# Patient Record
Sex: Female | Born: 1980 | Race: White | Hispanic: No | Marital: Married | Smoking: Current every day smoker
Health system: Southern US, Community
[De-identification: ages and names within clinical notes are randomized; demographics above are authoritative.]

## PROBLEM LIST (undated history)

## (undated) DIAGNOSIS — F32A Depression, unspecified: Secondary | ICD-10-CM

## (undated) DIAGNOSIS — N920 Excessive and frequent menstruation with regular cycle: Secondary | ICD-10-CM

## (undated) DIAGNOSIS — F329 Major depressive disorder, single episode, unspecified: Secondary | ICD-10-CM

## (undated) DIAGNOSIS — E559 Vitamin D deficiency, unspecified: Secondary | ICD-10-CM

## (undated) DIAGNOSIS — E739 Lactose intolerance, unspecified: Secondary | ICD-10-CM

## (undated) DIAGNOSIS — F1911 Other psychoactive substance abuse, in remission: Secondary | ICD-10-CM

## (undated) DIAGNOSIS — R011 Cardiac murmur, unspecified: Secondary | ICD-10-CM

## (undated) DIAGNOSIS — N809 Endometriosis, unspecified: Secondary | ICD-10-CM

## (undated) DIAGNOSIS — N946 Dysmenorrhea, unspecified: Secondary | ICD-10-CM

## (undated) DIAGNOSIS — T7840XA Allergy, unspecified, initial encounter: Secondary | ICD-10-CM

## (undated) DIAGNOSIS — N979 Female infertility, unspecified: Secondary | ICD-10-CM

## (undated) DIAGNOSIS — E785 Hyperlipidemia, unspecified: Secondary | ICD-10-CM

## (undated) DIAGNOSIS — F419 Anxiety disorder, unspecified: Secondary | ICD-10-CM

## (undated) HISTORY — DX: Vitamin D deficiency, unspecified: E55.9

## (undated) HISTORY — DX: Endometriosis, unspecified: N80.9

## (undated) HISTORY — DX: Hyperlipidemia, unspecified: E78.5

## (undated) HISTORY — DX: Allergy, unspecified, initial encounter: T78.40XA

## (undated) HISTORY — DX: Depression, unspecified: F32.A

## (undated) HISTORY — DX: Other psychoactive substance abuse, in remission: F19.11

## (undated) HISTORY — DX: Lactose intolerance, unspecified: E73.9

## (undated) HISTORY — DX: Anxiety disorder, unspecified: F41.9

## (undated) HISTORY — PX: CYSTECTOMY: SUR359

## (undated) HISTORY — DX: Major depressive disorder, single episode, unspecified: F32.9

## (undated) HISTORY — PX: EYE SURGERY: SHX253

## (undated) HISTORY — DX: Excessive and frequent menstruation with regular cycle: N92.0

## (undated) HISTORY — DX: Dysmenorrhea, unspecified: N94.6

## (undated) HISTORY — DX: Female infertility, unspecified: N97.9

---

## 2004-02-06 ENCOUNTER — Emergency Department: Payer: Self-pay | Admitting: Emergency Medicine

## 2004-02-08 ENCOUNTER — Emergency Department: Payer: Self-pay | Admitting: Emergency Medicine

## 2004-02-10 ENCOUNTER — Emergency Department: Payer: Self-pay | Admitting: General Practice

## 2004-02-11 ENCOUNTER — Emergency Department: Payer: Self-pay | Admitting: Emergency Medicine

## 2004-03-29 ENCOUNTER — Emergency Department: Payer: Self-pay | Admitting: Emergency Medicine

## 2004-12-15 ENCOUNTER — Emergency Department: Payer: Self-pay | Admitting: General Practice

## 2007-10-28 ENCOUNTER — Emergency Department: Payer: Self-pay | Admitting: Emergency Medicine

## 2007-12-01 ENCOUNTER — Emergency Department: Payer: Self-pay | Admitting: Emergency Medicine

## 2009-04-04 ENCOUNTER — Emergency Department: Payer: Self-pay | Admitting: Emergency Medicine

## 2010-05-11 ENCOUNTER — Emergency Department: Payer: Self-pay | Admitting: Unknown Physician Specialty

## 2013-06-10 ENCOUNTER — Emergency Department: Payer: Self-pay | Admitting: Emergency Medicine

## 2015-02-01 ENCOUNTER — Other Ambulatory Visit: Payer: Self-pay | Admitting: Obstetrics and Gynecology

## 2015-06-12 ENCOUNTER — Encounter: Payer: Self-pay | Admitting: Obstetrics and Gynecology

## 2015-06-28 ENCOUNTER — Ambulatory Visit (INDEPENDENT_AMBULATORY_CARE_PROVIDER_SITE_OTHER): Payer: 59 | Admitting: Obstetrics and Gynecology

## 2015-06-28 ENCOUNTER — Encounter: Payer: Self-pay | Admitting: Obstetrics and Gynecology

## 2015-06-28 VITALS — BP 95/55 | HR 69 | Ht 61.0 in | Wt 99.3 lb

## 2015-06-28 DIAGNOSIS — N979 Female infertility, unspecified: Secondary | ICD-10-CM

## 2015-06-28 DIAGNOSIS — Z01419 Encounter for gynecological examination (general) (routine) without abnormal findings: Secondary | ICD-10-CM | POA: Diagnosis not present

## 2015-06-28 NOTE — Progress Notes (Signed)
GYNECOLOGY ANNUAL PHYSICAL EXAM PROGRESS NOTE  Subjective:    Anita Hobbs is a 35 y.o.  G0P0 female who presents for an annual exam. The patient has no complaints today. The patient is sexually active. The patient wears seatbelts: yes. The patient participates in regular exercise: no. Has the patient ever been transfused or tattooed?: no. The patient reports that there is not domestic violence in her life.    Gynecologic History Menarche age: 64.  Reports regularly occuring menses, with mild-moderate dysmenorrhea.  Patient's last menstrual period was 06/08/2015. Contraception: none History of STI's: Denies Last Pap: 06/09/2014. Results were: normal.  Denies h/o abnormal pap smears.   Obstetric History   G0   P0   T0   P0   A0   TAB0   SAB0   E0   M0   L0       Past Medical History  Diagnosis Date  . Menorrhagia   . Dysmenorrhea   . Depression   . Anxiety   . Endometriosis   . Infertility, female   . Vitamin D deficiency     Past Surgical History  Procedure Laterality Date  . Cystectomy      @ age 58yrs, rt ovary     History reviewed. No pertinent family history.  Social History   Social History  . Marital Status: Married    Spouse Name: N/A  . Number of Children: N/A  . Years of Education: N/A   Occupational History  . Not on file.   Social History Main Topics  . Smoking status: Current Every Day Smoker -- 0.50 packs/day    Types: Cigarettes  . Smokeless tobacco: Not on file  . Alcohol Use: Yes     Comment: Occasionally   . Drug Use: No  . Sexual Activity: Yes    Birth Control/ Protection: None   Other Topics Concern  . Not on file   Social History Narrative  . No narrative on file    Current Outpatient Prescriptions on File Prior to Visit  Medication Sig Dispense Refill  . traMADol (ULTRAM) 50 MG tablet TAKE ONE TABLET BY MOUTH EVERY 6 HOURS AS NEEDED 30 tablet 0   No current facility-administered medications on file prior to visit.     No Known Allergies   Review of Systems Constitutional: negative for chills, fatigue, fevers and sweats Eyes: negative for irritation, redness and visual disturbance Ears, nose, mouth, throat, and face: negative for hearing loss, nasal congestion, snoring and tinnitus Respiratory: negative for asthma, cough, sputum Cardiovascular: negative for chest pain, dyspnea, exertional chest pressure/discomfort, irregular heart beat, palpitations and syncope Gastrointestinal: negative for abdominal pain, change in bowel habits, nausea and vomiting Genitourinary: negative for abnormal menstrual periods, genital lesions, sexual problems and vaginal discharge, dysuria and urinary incontinence Integument/breast: negative for breast lump, breast tenderness and nipple discharge Hematologic/lymphatic: negative for bleeding and easy bruising Musculoskeletal:negative for back pain and muscle weakness Neurological: negative for dizziness, headaches, vertigo and weakness Endocrine: negative for diabetic symptoms including polydipsia, polyuria and skin dryness Allergic/Immunologic: negative for hay fever and urticaria       Objective:  Blood pressure 95/55, pulse 69, height 5\' 1"  (1.549 m), weight 99 lb 4.8 oz (45.042 kg), last menstrual period 06/08/2015. Body mass index is 18.77 kg/(m^2).     General Appearance:    Alert, cooperative, no distress, appears stated age  Head:    Normocephalic, without obvious abnormality, atraumatic  Eyes:    PERRL, conjunctiva/corneas clear,  EOM's intact, both eyes  Ears:    Normal external ear canals, both ears  Nose:   Nares normal, septum midline, mucosa normal, no drainage or sinus tenderness  Throat:   Lips, mucosa, and tongue normal; teeth and gums normal  Neck:   Supple, symmetrical, trachea midline, no adenopathy; thyroid: no enlargement/tenderness/nodules; no carotid bruit or JVD  Back:     Symmetric, no curvature, ROM normal, no CVA tenderness  Lungs:      Clear to auscultation bilaterally, respirations unlabored  Chest Wall:    No tenderness or deformity   Heart:    Regular rate and rhythm, S1 and S2 normal, no murmur, rub or gallop  Breast Exam:    No tenderness, masses, or nipple abnormality  Abdomen:     Soft, non-tender, bowel sounds active all four quadrants, no masses, no organomegaly.    Genitalia:    Pelvic:external genitalia normal, vagina without lesions, discharge, or tenderness, rectovaginal septum  normal. Cervix normal in appearance, no cervical motion tenderness, no adnexal masses or tenderness.  Uterus normal size, shape, mobile, regular contours, nontender.  Rectal:    Normal external sphincter.  No hemorrhoids appreciated. Internal exam not done.   Extremities:   Extremities normal, atraumatic, no cyanosis or edema  Pulses:   2+ and symmetric all extremities  Skin:   Skin color, texture, turgor normal, no rashes or lesions  Lymph nodes:   Cervical, supraclavicular, and axillary nodes normal  Neurologic:   CNII-XII intact, normal strength, sensation and reflexes throughout   .  Assessment:    Healthy female exam.   Normal weight Contraception Tobacco abuse  Plan:     Blood tests: CBC with diff, Comprehensive metabolic panel, Lipoproteins and Vitamin D levels, and random glucose (desires labs to be performed for job). Breast self exam technique reviewed and patient encouraged to perform self-exam monthly. Contraception: none. Patient notes that she has never been on contraception, and has never gotten pregnant (been with partner x 15 years.  Notes partner has been tested, with normal sperm count, however does have nerve damage due to uncontrolled diabetes.  Patient has not been worked up. Is tracking fertile periods and having intercourse during ovulation period when possible. Discussed potential workup for infertility if desired.  Patient would like to undergo testing.  Will have patient scheduled for HSG.  Discussed  personal risk factors, such as smoking.  Discussed healthy lifestyle modifications. Pap smear up to date, repeat needed in 2019.   Counseled on smoking cessation.  Patient notes that she is not ready to quit, but is willing to try to start cutting down.  Will continue to f/u.  Follow up in 4-6 weeks after HSG (to be performed 3-5 days after next menstrual cycle).    [name], MD [facility]

## 2015-06-29 LAB — CBC WITH DIFFERENTIAL/PLATELET
Basophils Absolute: 0.1 10*3/uL (ref 0.0–0.2)
Basos: 1 %
EOS (ABSOLUTE): 0.1 10*3/uL (ref 0.0–0.4)
EOS: 2 %
HEMATOCRIT: 44.1 % (ref 34.0–46.6)
HEMOGLOBIN: 15 g/dL (ref 11.1–15.9)
IMMATURE GRANS (ABS): 0 10*3/uL (ref 0.0–0.1)
IMMATURE GRANULOCYTES: 0 %
LYMPHS: 29 %
Lymphocytes Absolute: 1.8 10*3/uL (ref 0.7–3.1)
MCH: 32.6 pg (ref 26.6–33.0)
MCHC: 34 g/dL (ref 31.5–35.7)
MCV: 96 fL (ref 79–97)
MONOCYTES: 8 %
Monocytes Absolute: 0.5 10*3/uL (ref 0.1–0.9)
NEUTROS PCT: 60 %
Neutrophils Absolute: 3.8 10*3/uL (ref 1.4–7.0)
Platelets: 108 10*3/uL — ABNORMAL LOW (ref 150–379)
RBC: 4.6 x10E6/uL (ref 3.77–5.28)
RDW: 13.5 % (ref 12.3–15.4)
WBC: 6.2 10*3/uL (ref 3.4–10.8)

## 2015-06-29 LAB — GLUCOSE, RANDOM: Glucose: 87 mg/dL (ref 65–99)

## 2015-06-29 LAB — LIPID PANEL
CHOL/HDL RATIO: 3.8 ratio (ref 0.0–4.4)
Cholesterol, Total: 141 mg/dL (ref 100–199)
HDL: 37 mg/dL — AB (ref >39)
LDL CALC: 93 mg/dL (ref 0–99)
TRIGLYCERIDES: 54 mg/dL (ref 0–149)
VLDL Cholesterol Cal: 11 mg/dL (ref 5–40)

## 2015-06-29 LAB — VITAMIN D 25 HYDROXY (VIT D DEFICIENCY, FRACTURES): Vit D, 25-Hydroxy: 32.5 ng/mL (ref 30.0–100.0)

## 2015-07-02 ENCOUNTER — Encounter: Payer: Self-pay | Admitting: Obstetrics and Gynecology

## 2015-07-02 ENCOUNTER — Telehealth: Payer: Self-pay

## 2015-07-02 DIAGNOSIS — D693 Immune thrombocytopenic purpura: Secondary | ICD-10-CM

## 2015-07-02 NOTE — Telephone Encounter (Signed)
Called pt informed of the need for repeat lab.

## 2015-07-02 NOTE — Telephone Encounter (Signed)
-----   Message from [name], MD sent at 06/29/2015  9:26 AM EST ----- Please inform patient of normal annual labs, except low platelet count (thrombocytopenia).  Will need to repeat levels in ~ 4-6 weeks to see if this corrects itself. Also with slightly decreased HDL.  No intervention required currently.

## 2015-07-31 ENCOUNTER — Ambulatory Visit
Admission: RE | Admit: 2015-07-31 | Discharge: 2015-07-31 | Disposition: A | Discharge status: Home / Self Care | Payer: 59 | Source: Ambulatory Visit | Attending: Obstetrics and Gynecology | Admitting: Obstetrics and Gynecology

## 2015-07-31 DIAGNOSIS — N979 Female infertility, unspecified: Secondary | ICD-10-CM | POA: Diagnosis not present

## 2015-07-31 MED ORDER — IOPAMIDOL (ISOVUE-370) INJECTION 76%
20.0000 mL | Freq: Once | INTRAVENOUS | Status: AC | PRN
  Administered 2015-07-31: 20 mL

## 2015-08-07 ENCOUNTER — Encounter: Payer: Self-pay | Admitting: Obstetrics and Gynecology

## 2015-08-07 ENCOUNTER — Ambulatory Visit (INDEPENDENT_AMBULATORY_CARE_PROVIDER_SITE_OTHER): Payer: 59 | Admitting: Obstetrics and Gynecology

## 2015-08-07 VITALS — BP 107/63 | HR 82 | Ht 61.0 in | Wt 99.5 lb

## 2015-08-07 DIAGNOSIS — Z3169 Encounter for other general counseling and advice on procreation: Secondary | ICD-10-CM | POA: Diagnosis not present

## 2015-08-07 NOTE — Progress Notes (Signed)
    GYNECOLOGY PROGRESS NOTE  Subjective:    Patient ID: Anita Hobbs, female    DOB: 1980-05-24, 35 y.o.   MRN: [id number]  HPI  Patient is a 35 y.o. G0P0000 female who presents for follow up of HSG results for workup of primary infertility.  Patient denies complaints today.    The following portions of the patient's history were reviewed and updated as appropriate: allergies, current medications, past family history, past medical history, past social history, past surgical history and problem list.  Review of Systems A comprehensive review of systems was negative.   Objective:   Blood pressure 107/63, pulse 82, height 5\' 1"  (1.549 m), weight 99 lb 8 oz (45.133 kg), last menstrual period 07/31/2015. General appearance: alert and no distress Exam deferred   Imaging:  EXAM: HYSTEROSALPINGOGRAM  TECHNIQUE: Hysterosalpingogram was performed by the ordering physician under fluoroscopy. Fluoroscopic images were submitted for radiologic interpretation following the procedure. Please see the procedural report for the amount of contrast and the fluoroscopy time utilized.  COMPARISON: None.  FINDINGS: Endometrial cavity is normal. Both fallopian tubes fill and have a normal appearance. Normal spillage bilaterally.  IMPRESSION: Normal study.  Assessment:   Infertility, primary  Plan:   Discussion had with patient regarding next steps in management of infertility.  Patient with normal HSG, has h/o normal cycles (q 24-25 days).  Advised on menstrual calendar, noting fertile days.  Notes husband has had a semen analysis in the past, was normal.  Husband does have h/o DM and ED.  Is planning to initiate medication for ED next month.  Counseled on timing of coitus around fertile days and ovulation (around day 12 of cycle), can use ovulation kits.  Should have intercourse daily in the 2-3 days preceding ovulation.   To f/u in 3 months to reassess. Discussed possibility of  needing further assistance with infertility from a specialist if partner unable to perform routinely around fertile days .  A total of 15 minutes were spent face-to-face with the patient during this encounter and over half of that time dealt with counseling and coordination of care.   [name], MD [facility]

## 2015-11-06 ENCOUNTER — Ambulatory Visit (INDEPENDENT_AMBULATORY_CARE_PROVIDER_SITE_OTHER): Payer: 59 | Admitting: Obstetrics and Gynecology

## 2015-11-06 ENCOUNTER — Encounter: Payer: Self-pay | Admitting: Obstetrics and Gynecology

## 2015-11-06 VITALS — BP 104/59 | HR 71 | Ht 61.0 in | Wt 100.7 lb

## 2015-11-06 DIAGNOSIS — Z319 Encounter for procreative management, unspecified: Secondary | ICD-10-CM

## 2015-11-06 NOTE — Progress Notes (Signed)
    GYNECOLOGY PROGRESS NOTE  Subjective:    Patient ID: Anita Hobbs, female    DOB: 17-Mar-1981, 35 y.o.   MRN: [id number]  HPI  Patient is a 35 y.o. G0P0000 female who presents for f/u of infertility.  Had HSG performed 3 months ago. Denies complaints today.  Notes that she has been tracking her cycles, vary between q 22-25 days.  Notes that even with husband's h/o ED, is still able to attempt timed coitus most days.  The following portions of the patient's history were reviewed and updated as appropriate: allergies, current medications, past family history, past medical history, past social history, past surgical history and problem list.  Review of Systems Pertinent items noted in HPI and remainder of comprehensive ROS otherwise negative.   Objective:   Blood pressure 104/59, pulse 71, height 5\' 1"  (1.549 m), weight 100 lb 11.2 oz (45.677 kg), last menstrual period 11/02/2015. Exam deferred.   Assessment:   Infertility management  Plan:   Has been performing timed coitus for 3 months (although notes that 1 month she was unable to due to husband being sick around that time). To continue menstrual calendar.  Also advised on using ovulation kits.  Will have patient perform [place] (is currently Day 5).  Patient also to return for progesterone level (will have performed at Day 18 or 19 due to h/o shorter cycles).   - Will have patient f/u in 1 month after labs completed.  Discussed possibility of needing further assistance with infertility from a specialist (i.e. IUI) if partner unable to perform routinely around fertile days and may need Clomid for unexplained infertility.   [name], MD [facility]

## 2015-11-06 NOTE — Patient Instructions (Signed)
Return 11/20/2015 for lab only (progesterone level).  Use ovulation kit (over the counter) for this month.  Return in 1 month for next visit to review all labs and discuss next steps in management.

## 2015-11-07 LAB — FOLLICLE STIMULATING HORMONE: FSH: 10.2 m[IU]/mL

## 2015-11-20 ENCOUNTER — Other Ambulatory Visit: Payer: 59

## 2015-11-20 DIAGNOSIS — Z319 Encounter for procreative management, unspecified: Secondary | ICD-10-CM

## 2015-11-21 LAB — PROGESTERONE: Progesterone: 7.2 ng/mL

## 2015-11-26 ENCOUNTER — Telehealth: Payer: Self-pay

## 2015-11-26 NOTE — Telephone Encounter (Signed)
-----   Message from [name], MD sent at 11/23/2015  1:41 PM EDT ----- Please inform patient that it does appear that she has ovulated this month.  To continue with monthly timed coitus (having intercourse on days prior to ovulation) and Day 21 progesterones.

## 2015-11-26 NOTE — Telephone Encounter (Signed)
Called pt informed her of day 68

## 2015-12-04 ENCOUNTER — Ambulatory Visit: Payer: 59 | Admitting: Obstetrics and Gynecology

## 2015-12-06 ENCOUNTER — Ambulatory Visit (INDEPENDENT_AMBULATORY_CARE_PROVIDER_SITE_OTHER): Payer: 59 | Admitting: Obstetrics and Gynecology

## 2015-12-06 ENCOUNTER — Encounter: Payer: Self-pay | Admitting: Obstetrics and Gynecology

## 2015-12-06 VITALS — BP 106/66 | HR 88 | Ht 61.0 in | Wt 99.6 lb

## 2015-12-06 DIAGNOSIS — Z319 Encounter for procreative management, unspecified: Secondary | ICD-10-CM

## 2015-12-06 NOTE — Progress Notes (Signed)
    GYNECOLOGY PROGRESS NOTE  Subjective:    Patient ID: Anita Hobbs, female    DOB: [birth year], 35 y.o.   MRN: [id number]  HPI  Patient is a 35 y.o. G0P0000 female who presents for f/u of infertility labs.  Patient notes that she has been using ovulation kits.  States that she had her progesterone labs performed that told her she did not ovulate, but then a week or 2 later her ovulation kit noted that she did ovulate the following period.  Notes ovulation occurred on 11/20/15.  Patient's last menstrual period was 11/26/2015.   Patient also states that she is trying to perform timed coitus when ovulating, however notes that with husband's ED, sometimes he is not able to perform "on que, or intercourse can occur however he does not ejaculate".   The following portions of the patient's history were reviewed and updated as appropriate: allergies, current medications, past family history, past medical history, past social history, past surgical history and problem list.  Review of Systems A comprehensive review of systems was negative.   Objective:   Blood pressure 106/66, pulse 88, height '5\' 1"'$  (1.549 m), weight 99 lb 9.6 oz (45.2 kg), last menstrual period 11/26/2015. General appearance: alert and no distress Remainder of exam deferred.    Assessment:   Infertility, primary  Plan:   - Discussion had with patient that her ovulation may be occurring later than anticipated (she has a longer luteal phase), or that she only ovulates every other month.  Encouraged patient to keep up with next set of ovulation days based on ovulation kit. If she ovulates 2 months straight, then likely the progesterone test was drawn at the incorrect time in her cycle and she is not oligo-ovulatory.  Discussed that if she does not ovulate next month, then she may need assistance with ovulation induction medications.   - Discussed possible need for referral to REI specialist to assist with fertility issues.  Patient  may only require IUI (intrauterine insemination), as husband is able to perform, however just not always on a schedule.  Suggested that husband can collect sperm outside of ovulation time, which could then be stored, and then a specialist could perform IUI around the time of patient's ovulation period. Patient ok with plan.  Notes that she would not want to embark upon other extensive measures with costly procedures and drugs.   Patient can f/u after consultation, whenever scheduled.   A total of 15 minutes were spent face-to-face with the patient during this encounter and over half of that time dealt with counseling and coordination of care.  Anita Maid, MD [facility]   -

## 2016-01-25 ENCOUNTER — Telehealth: Payer: Self-pay | Admitting: Obstetrics and Gynecology

## 2016-01-25 NOTE — Telephone Encounter (Signed)
Please call. Thank you. 

## 2016-01-25 NOTE — Telephone Encounter (Signed)
PT CALLED AND SHE IS GOING THE A STRESSFUL SITUATION AND SHE HAS LOST 8 POUNDS SINCE September, BUT WITH HER NERVES SHE IS SO SICK, EVERY TIME SHE EATS SHE GETS NAUSEAS, SHE WANTED TO KNOW ANOTHER WAY THAT WOULD HELP HER GET NUTRITION.

## 2016-01-28 NOTE — Telephone Encounter (Signed)
Called pt no answer. Unable to leave message as no voicemail is set up. Will send mychart message.

## 2016-04-16 ENCOUNTER — Ambulatory Visit (INDEPENDENT_AMBULATORY_CARE_PROVIDER_SITE_OTHER): Payer: 59 | Admitting: Obstetrics and Gynecology

## 2016-04-16 ENCOUNTER — Encounter: Payer: Self-pay | Admitting: Obstetrics and Gynecology

## 2016-04-16 VITALS — BP 102/61 | HR 79 | Ht 61.0 in | Wt 98.1 lb

## 2016-04-16 DIAGNOSIS — N946 Dysmenorrhea, unspecified: Secondary | ICD-10-CM | POA: Diagnosis not present

## 2016-04-16 DIAGNOSIS — Z8742 Personal history of other diseases of the female genital tract: Secondary | ICD-10-CM | POA: Diagnosis not present

## 2016-04-16 DIAGNOSIS — Z01419 Encounter for gynecological examination (general) (routine) without abnormal findings: Secondary | ICD-10-CM

## 2016-04-16 DIAGNOSIS — D693 Immune thrombocytopenic purpura: Secondary | ICD-10-CM | POA: Diagnosis not present

## 2016-04-16 DIAGNOSIS — R300 Dysuria: Secondary | ICD-10-CM

## 2016-04-16 LAB — POCT URINALYSIS DIPSTICK
Bilirubin, UA: NEGATIVE
Glucose, UA: NEGATIVE
Ketones, UA: NEGATIVE
NITRITE UA: NEGATIVE
PH UA: 6.5
Spec Grav, UA: 1.01
UROBILINOGEN UA: NEGATIVE

## 2016-04-16 MED ORDER — PHENAZOPYRIDINE HCL 95 MG PO TABS: 95.0000 mg | ORAL_TABLET | Freq: Three times a day (TID) | ORAL | 0 refills | Status: DC | PRN

## 2016-04-16 MED ORDER — CIPROFLOXACIN HCL 500 MG PO TABS: 500.0000 mg | ORAL_TABLET | Freq: Two times a day (BID) | ORAL | 0 refills | Status: DC

## 2016-04-16 MED ORDER — TRAMADOL HCL 50 MG PO TABS: 50.0000 mg | ORAL_TABLET | Freq: Four times a day (QID) | ORAL | 2 refills | Status: DC | PRN

## 2016-04-16 NOTE — Patient Instructions (Signed)
Urinary Tract Infection, Adult Introduction A urinary tract infection (UTI) is an infection of any part of the urinary tract. The urinary tract includes the:  Kidneys.  Ureters.  Bladder.  Urethra. These organs make, store, and get rid of pee (urine) in the body. Follow these instructions at home:  Take over-the-counter and prescription medicines only as told by your doctor.  If you were prescribed an antibiotic medicine, take it as told by your doctor. Do not stop taking the antibiotic even if you start to feel better.  Avoid the following drinks:  Alcohol.  Caffeine.  Tea.  Carbonated drinks.  Drink enough fluid to keep your pee clear or pale yellow.  Keep all follow-up visits as told by your doctor. This is important.  Make sure to:  Empty your bladder often and completely. Do not to hold pee for long periods of time.  Empty your bladder before and after sex.  Wipe from front to back after a bowel movement if you are female. Use each tissue one time when you wipe. Contact a doctor if:  You have back pain.  You have a fever.  You feel sick to your stomach (nauseous).  You throw up (vomit).  Your symptoms do not get better after 3 days.  Your symptoms go away and then come back. Get help right away if:  You have very bad back pain.  You have very bad lower belly (abdominal) pain.  You are throwing up and cannot keep down any medicines or water. This information is not intended to replace advice given to you by your health care provider. Make sure you discuss any questions you have with your health care provider. Document Released: 09/24/2007 Document Revised: 09/13/2015 Document Reviewed: 02/26/2015  2017 [employer]vier  

## 2016-04-16 NOTE — Progress Notes (Signed)
    GYNECOLOGY PROGRESS NOTE  Subjective:    Patient ID: Anita Hobbs, female    DOB: [birth year], 35 y.o.   MRN: [id number]  HPI  Patient is a 35 y.o. G0P0000 female who presents for refill of medications for dysmenorrhea.  Notes that she has recently separated from husband (in September).  No longer wants to proceed with fertility management at this time. Patient also notes that she thinks she may have a UTI.  Is noting urinary frequency, hesitancy, and dysuria. Symptoms ongoing x 1.5 weeks. Has tried increasing hydration and cranberry juice with no relief.   The following portions of the patient's history were reviewed and updated as appropriate: allergies, current medications, past family history, past medical history, past social history, past surgical history and problem list.  Review of Systems Pertinent items noted in HPI and remainder of comprehensive ROS otherwise negative.   Objective:   Blood pressure 102/61, pulse 79, height 5\' 1"  (1.549 m), weight 98 lb 1.6 oz (44.5 kg), last menstrual period 03/29/2016. Body mass index is 18.54 kg/m.  General appearance: alert and no distress Abdomen: soft, non-tender; bowel sounds normal; no masses,  no organomegaly.  No CVA tenderness.  Pelvic: deferred   Labs:  Results for orders placed or performed in visit on 04/16/16  POCT urinalysis dipstick  Result Value Ref Range   Color, UA dark yellow    Clarity, UA clear    Glucose, UA neg    Bilirubin, UA neg    Ketones, UA neg    Spec Grav, UA 1.010    Blood, UA NHT    pH, UA 6.5    Protein, UA trace    Urobilinogen, UA negative    Nitrite, UA neg    Leukocytes, UA large (3+) (A) Negative    Assessment:   Dysmenorrhea Infertility (primary) UTI symptoms  Plan:   - Patient with dysmenorrhea, usually managed with Tramadol.  Desires refill.   - Patient unsure if she desires to proceed with fertility in the future as she is not sure of whether or not she will reconcile with  her husband.  In the mean time, unsure if she desires to initiate any form of birth control for fear of harming her chances at fertility.  - UTI symptoms, 3+ leukocytes on UA today. No nitrites.  Will send for culture.  Will treat empirically as patient's symptoms not relieved with conservative measures.  Prescribed Cipro 500 mg BID x 3 days.  - To f/u if symptoms worsen or do not resolve.    [name], MD [facility]

## 2016-04-16 NOTE — Addendum Note (Signed)
Addended by: Donalee Citrin on: 04/16/2016 02:29 PM   Modules accepted: Orders

## 2016-04-18 ENCOUNTER — Other Ambulatory Visit: Payer: 59

## 2016-04-18 NOTE — Addendum Note (Signed)
Addended by: Alphonsa Overall on: 04/18/2016 12:21 PM   Modules accepted: Orders

## 2016-04-19 LAB — URINE CULTURE

## 2016-04-23 ENCOUNTER — Encounter: Payer: Self-pay | Admitting: Obstetrics and Gynecology

## 2016-04-24 LAB — HEMOGLOBIN A1C
ESTIMATED AVERAGE GLUCOSE: 103 mg/dL
HEMOGLOBIN A1C: 5.2 % (ref 4.8–5.6)

## 2016-04-24 LAB — NICOTINE/COTININE METABOLITES
COTININE: 242.8 ng/mL
NICOTINE: 11.9 ng/mL

## 2016-04-24 LAB — PLATELET COUNT: Platelets: 209 10*3/uL (ref 150–379)

## 2016-05-14 ENCOUNTER — Other Ambulatory Visit: Payer: Self-pay

## 2018-01-25 ENCOUNTER — Other Ambulatory Visit (HOSPITAL_COMMUNITY)
Admission: RE | Admit: 2018-01-25 | Discharge: 2018-01-25 | Disposition: A | Discharge status: Home / Self Care | Payer: 59 | Source: Ambulatory Visit | Attending: Obstetrics and Gynecology | Admitting: Obstetrics and Gynecology

## 2018-01-25 ENCOUNTER — Ambulatory Visit (INDEPENDENT_AMBULATORY_CARE_PROVIDER_SITE_OTHER): Payer: 59 | Admitting: Obstetrics and Gynecology

## 2018-01-25 ENCOUNTER — Encounter: Payer: Self-pay | Admitting: Obstetrics and Gynecology

## 2018-01-25 VITALS — BP 106/68 | HR 85 | Ht 61.0 in | Wt 96.7 lb

## 2018-01-25 DIAGNOSIS — Z124 Encounter for screening for malignant neoplasm of cervix: Secondary | ICD-10-CM | POA: Insufficient documentation

## 2018-01-25 DIAGNOSIS — N809 Endometriosis, unspecified: Secondary | ICD-10-CM | POA: Diagnosis not present

## 2018-01-25 DIAGNOSIS — Z01419 Encounter for gynecological examination (general) (routine) without abnormal findings: Secondary | ICD-10-CM | POA: Diagnosis not present

## 2018-01-25 DIAGNOSIS — N946 Dysmenorrhea, unspecified: Secondary | ICD-10-CM | POA: Diagnosis not present

## 2018-01-25 MED ORDER — LIDO-CAPSAICIN-MEN-METHYL SAL 4-0.0375-5-20 % EX PTCH: 1.0000 | MEDICATED_PATCH | Freq: Two times a day (BID) | CUTANEOUS | 1 refills | Status: DC | PRN

## 2018-01-25 MED ORDER — TRAMADOL HCL 50 MG PO TABS: 50.0000 mg | ORAL_TABLET | Freq: Four times a day (QID) | ORAL | 2 refills | Status: DC | PRN
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
## 2018-01-25 NOTE — Patient Instructions (Signed)

## 2018-01-25 NOTE — Progress Notes (Signed)
  PT is present today for her annual exam. Pt stated that she has not been doing self-breast exams monthly, but stated that she does them occassionally. Pt stated that she is doing well and denies any issues. No problems or concerns.

## 2018-01-29 LAB — CYTOLOGY - PAP
Diagnosis: NEGATIVE
HPV (WINDOPATH): DETECTED — AB
HPV 16/18/45 GENOTYPING: NEGATIVE

## 2018-05-18 ENCOUNTER — Encounter: Payer: Self-pay | Admitting: Obstetrics and Gynecology

## 2018-05-18 ENCOUNTER — Ambulatory Visit: Payer: Managed Care, Other (non HMO) | Admitting: Obstetrics and Gynecology

## 2018-05-18 VITALS — BP 117/71 | HR 92 | Ht 61.0 in | Wt 100.9 lb

## 2018-05-18 DIAGNOSIS — R112 Nausea with vomiting, unspecified: Secondary | ICD-10-CM

## 2018-05-18 DIAGNOSIS — N938 Other specified abnormal uterine and vaginal bleeding: Secondary | ICD-10-CM | POA: Diagnosis not present

## 2018-05-18 DIAGNOSIS — N946 Dysmenorrhea, unspecified: Secondary | ICD-10-CM | POA: Diagnosis not present

## 2018-05-18 DIAGNOSIS — Z8742 Personal history of other diseases of the female genital tract: Secondary | ICD-10-CM | POA: Diagnosis not present

## 2018-05-18 LAB — POCT URINE PREGNANCY: PREG TEST UR: NEGATIVE

## 2018-05-18 NOTE — Patient Instructions (Signed)
Dysfunctional Uterine Bleeding  Dysfunctional uterine bleeding is abnormal bleeding from the uterus. Dysfunctional uterine bleeding includes:  A menstrual period that comes earlier or later than usual.  A menstrual period that is lighter or heavier than usual, or has large blood clots.  Vaginal bleeding between menstrual periods.  Skipping one or more menstrual periods.  Vaginal bleeding after sex.  Vaginal bleeding after menopause.  Follow these instructions at home:  Eating and drinking    Eat well-balanced meals. Include foods that are high in iron, such as liver, meat, shellfish, green leafy vegetables, and eggs.  To prevent or treat constipation, your health care provider may recommend that you:  Drink enough fluid to keep your urine pale yellow.  Take over-the-counter or prescription medicines.  Eat foods that are high in fiber, such as beans, whole grains, and fresh fruits and vegetables.  Limit foods that are high in fat and processed sugars, such as fried or sweet foods.  Medicines  Take over-the-counter and prescription medicines only as told by your health care provider.  Do not change medicines without talking with your health care provider.  Aspirin or medicines that contain aspirin may make the bleeding worse. Do not take those medicines:  During the week before your menstrual period.  During your menstrual period.  If you were prescribed iron pills, take them as told by your health care provider. Iron pills help to replace iron that your body loses because of this condition.  Activity  If you need to change your sanitary pad or tampon more than one time every 2 hours:  Lie in bed with your feet raised (elevated).  Place a cold pack on your lower abdomen.  Rest as much as possible until the bleeding stops or slows down.  Do not try to lose weight until the bleeding has stopped and your blood iron level is back to normal.  General instructions    For two months, write down:  When your menstrual period  starts.  When your menstrual period ends.  When any abnormal vaginal bleeding occurs.  What problems you notice.  Keep all follow up visits as told by your health care provider. This is important.  Contact a health care provider if you:  Feel light-headed or weak.  Have nausea and vomiting.  Cannot eat or drink without vomiting.  Feel dizzy or have diarrhea while you are taking medicines.  Are taking birth control pills or hormones, and you want to change them or stop taking them.  Get help right away if:  You develop a fever or chills.  You need to change your sanitary pad or tampon more than one time per hour.  Your vaginal bleeding becomes heavier, or your flow contains clots more often.  You develop pain in your abdomen.  You lose consciousness.  You develop a rash.  Summary  Dysfunctional uterine bleeding is abnormal bleeding from the uterus.  It includes menstrual bleeding of abnormal duration, volume, or regularity.  Bleeding after sex and after menopause are also considered dysfunctional uterine bleeding.  This information is not intended to replace advice given to you by your health care provider. Make sure you discuss any questions you have with your health care provider.  Document Released: 04/04/2000 Document Revised: 09/16/2017 Document Reviewed: 09/16/2017  Elsevier Interactive Patient Education  2019 [employer]

## 2018-05-18 NOTE — Progress Notes (Signed)
Pt is present today due to heavy bleeding and cycles last over 9 days. Pt stated that she is having clots, pain and cramping. Pt stated that she has been nauseated, vomiting and at times unable to work due to the illness. Pt stated that she is tired all the time and have no energy to do anything. Pt also stated that she is losing a lot of weight.

## 2018-05-18 NOTE — Progress Notes (Signed)
    GYNECOLOGY PROGRESS NOTE  Subjective:    Patient ID: Anita Hobbs, female    DOB: 1980/11/01, 38 y.o.   MRN: [id number]  HPI  Patient is a 38 y.o. G0P0000 female with h/o endometriosis who presents for complaints of abnormal menstrual cycles for the past several months.  Cycles are lasting for approximately 7-9 days (previously 5-6), are heavy with passage of blood clots and are more painful than usual.  Pt stated that she has been nauseated, vomiting and at times unable to work due to the illness with this cycle. She is also feeling more fatigued and is experiencing weight loss (2.5 lbs this week, suspects it is due to the vomiting). She takes the Tramadol for her pain, however notes that it was not effective these last 2 cycles.  She is currently sexually active, has a new partner of 2 months. Has had unprotected intercourse. Denies any abnormal vaginal discharge or concern for STD exposure.   The following portions of the patient's history were reviewed and updated as appropriate: allergies, current medications, past family history, past medical history, past social history, past surgical history and problem list.  Review of Systems Pertinent items noted in HPI and remainder of comprehensive ROS otherwise negative.   Objective:   Blood pressure 117/71, pulse 92, height 5\' 1"  (1.549 m), weight 100 lb 14.4 oz (45.8 kg), last menstrual period 05/09/2018. General appearance: alert and no distress Abdomen: soft, non-tender; bowel sounds normal; no masses,  no organomegaly Pelvic: external genitalia normal, rectovaginal septum normal.  Vagina with scant thin white discharge, no odor.  Cervix normal appearing, no lesions and no motion tenderness.  Uterus mobile, non-tender, normal shape and size.  Adnexae non-palpable, nontender bilaterally.  Extremities: extremities normal, atraumatic, no cyanosis or edema Neurologic: Grossly normal   Assessment:   H/o endometriosis Severe  dysmenorrhea DUB Nausea and vomiting  Plan:   - Discussion had regarding management of endometriosis.  Would recommend hormonal suppression with contraception.  UPT performed today, negative. Discussed options for contraceptive suppression. Patient desires OCPs.  She is a smoker, over age 9, so will prescribe progesterone-only method Slynd (sample given today). If medication helps, will prescribe.  - Continue Tramadol for severe dysmenorrhea. Can also alternate with Tylenol or Motrin.  - Patient not concerned for STD's, no discharge noted on exam, declined testing.  - Nausea/vomiting associated with menses. Should improve with OCPs.  - Follow up in 1 month.    [name], MD [facility]

## 2018-06-07 ENCOUNTER — Telehealth: Payer: Self-pay | Admitting: Obstetrics and Gynecology

## 2018-06-07 MED ORDER — DROSPIRENONE 4 MG PO TABS: 4.0000 mg | ORAL_TABLET | Freq: Every day | ORAL | 8 refills | Status: DC

## 2018-06-07 NOTE — Telephone Encounter (Signed)
Patient called stating she finished her sample of Slynz and would like a script sent to [facility] on [address].Thanks

## 2018-06-07 NOTE — Telephone Encounter (Signed)
Completed medication was sent to pharmacy

## 2018-06-10 ENCOUNTER — Telehealth: Payer: Self-pay | Admitting: Obstetrics and Gynecology

## 2018-06-10 MED ORDER — NORETHINDRONE 0.35 MG PO TABS: 1.0000 | ORAL_TABLET | Freq: Every day | ORAL | 8 refills | Status: DC

## 2018-06-10 NOTE — Telephone Encounter (Signed)
Ok.  Can send in [employer]

## 2018-06-10 NOTE — Telephone Encounter (Signed)
Pt called and informed that Micronor was called in for her due to the issues she was having with her insurance.

## 2018-06-10 NOTE — Telephone Encounter (Signed)
Spoke with pt to see which BCP she was taking pt stated that it was the [facility] and her insurance will not cover it. Pt wants to know if she could take other BCP that her insurance will cover and do not contain estrogen.

## 2018-06-10 NOTE — Addendum Note (Signed)
Addended by: Edwyna Shell on: 06/10/2018 04:19 PM   Modules accepted: Orders

## 2018-06-10 NOTE — Telephone Encounter (Signed)
I gave her  a sample of Slynd the last time.  Slynd is progesterone only and that's the one that should be called in.

## 2018-06-10 NOTE — Telephone Encounter (Signed)
The patient is asking if she can get a different BCP, b/c her insurance will not cover due to her being a smoker and she needs a BCP with no estrogen. Please advise, thanks.

## 2018-06-17 ENCOUNTER — Encounter: Payer: Managed Care, Other (non HMO) | Admitting: Obstetrics and Gynecology

## 2018-07-15 ENCOUNTER — Encounter: Payer: Managed Care, Other (non HMO) | Admitting: Obstetrics and Gynecology

## 2018-08-02 ENCOUNTER — Telehealth: Payer: Self-pay | Admitting: Obstetrics and Gynecology

## 2018-08-02 NOTE — Telephone Encounter (Signed)
Patient called stating she has been bleeding frequently since taking the new birth control. She also states the periods have been lasting longer as well. She didn't know if this is normal. Thanks

## 2018-08-04 ENCOUNTER — Telehealth: Payer: Self-pay | Admitting: Obstetrics and Gynecology

## 2018-08-04 NOTE — Telephone Encounter (Signed)
Pt was called back and spoke with her concerning her call to the office this evening. Pt stated that she was on birth control to help with her cycles and cramps. Pt stated that she had her cycle and 5 days later her cycle would start again. Pt made an appointment for a televisit with [facility] on Friday, April 17th.

## 2018-08-04 NOTE — Telephone Encounter (Signed)
Spoke with pt about her concerns for calling the office. Pt stated that since she has been taking the birth control she will start her cycle and stop and 5 days later she would start ago. Pt has an appointment with [facility] on Friday (televisit) to discuss other BC options.

## 2018-08-04 NOTE — Telephone Encounter (Signed)
The patient called and stated that she needs to speak with a nurse or provider in regards to a medication reaction, and concerns. The patient stated that she sent a message back 2 days ago and never got a response. The patient is hoping to receive a call back soon. Please advise.

## 2018-08-06 ENCOUNTER — Other Ambulatory Visit: Payer: Self-pay

## 2018-08-06 ENCOUNTER — Encounter: Payer: Self-pay | Admitting: Obstetrics and Gynecology

## 2018-08-06 ENCOUNTER — Ambulatory Visit (INDEPENDENT_AMBULATORY_CARE_PROVIDER_SITE_OTHER): Payer: Managed Care, Other (non HMO) | Admitting: Obstetrics and Gynecology

## 2018-08-06 VITALS — Ht 61.0 in | Wt 104.0 lb

## 2018-08-06 DIAGNOSIS — N946 Dysmenorrhea, unspecified: Secondary | ICD-10-CM

## 2018-08-06 DIAGNOSIS — Z8742 Personal history of other diseases of the female genital tract: Secondary | ICD-10-CM

## 2018-08-06 DIAGNOSIS — N921 Excessive and frequent menstruation with irregular cycle: Secondary | ICD-10-CM

## 2018-08-06 DIAGNOSIS — N938 Other specified abnormal uterine and vaginal bleeding: Secondary | ICD-10-CM

## 2018-08-06 MED ORDER — NORETHINDRONE ACETATE 5 MG PO TABS: 5.0000 mg | ORAL_TABLET | Freq: Every day | ORAL | 2 refills | Status: DC

## 2018-08-06 NOTE — Progress Notes (Signed)
  Virtual Visit via Telephone Note  I connected with [name] on 08/06/18 at  3:30 PM EDT by telephone and verified that I am speaking with the correct person using two identifiers.   I discussed the limitations, risks, security and privacy concerns of performing an evaluation and management service by telephone and the availability of in person appointments. I also discussed with the patient that there may be a patient responsible charge related to this service. The patient expressed understanding and agreed to proceed.   History of Present Illness: Anita Hobbs is a 38 y.o. G0P0000 female with a h/o endometriosis, infertility, and severe dysmenorrhea who complains of dysfunctional uterine bleeding since starting birth control Micronor in February.  Notes that she has bleeding almost every 2 weeks.  Second episode of bleeding each month is usually lighter and only lasts 1-3 days, menstrual cycle lasts for 7-8 days.  Notes that she has not noticed much difference in her pain at this point either.    Observations/Objective: Height 5\' 1"  (1.549 m), weight 104 lb (47.2 kg), last menstrual period 08/01/2018.   Assessment and Plan: 1. DUB with breakthrough bleeding on OCPs. Discussed alternative progesterone-only methods for contraception (including Depo provera, Nexplanon, IUD) due to her age and h/o smoking.  Patient notes that none of these options are appealing to her.  Offered a different hormonal option of Aygestin but informed that it was not a birth control.  Patient states that with her history of infertility that she was not concerned for pregnancy, but if it did happen this was ok too.  Will prescribe 2. H/o endometriosis - will try to prescribe Aygestin for management of endometriosis symptoms (dysmenorrhea, abnormal bleeding). If no improvement in symptoms in 1 month, may need to consider a trial of [place]ilissa. 3. Dysmenorrhea - patient to use OTC meds, and has Tramadol for severe pain  as needed.   Follow Up Instructions:  Follow up symptoms in 1 month via televist.    I discussed the assessment and treatment plan with the patient. The patient was provided an opportunity to ask questions and all were answered. The patient agreed with the plan and demonstrated an understanding of the instructions.   The patient was advised to call back or seek an in-person evaluation if the symptoms worsen or if the condition fails to improve as anticipated.  I provided 10 minutes of non-face-to-face time during this encounter.   [name], MD  [facility]

## 2018-08-06 NOTE — Progress Notes (Signed)
Telephone visit. Pt transferred from the front desk. CC, vitals. Medications and allergies were reviewed and updated. Pt stated that she has bleeding irregular while on birth control. Pt stated that she would have a cycle stop and less than 5 days later she would be bleeding again or spotting.

## 2018-09-03 ENCOUNTER — Ambulatory Visit (INDEPENDENT_AMBULATORY_CARE_PROVIDER_SITE_OTHER): Payer: Managed Care, Other (non HMO) | Admitting: Obstetrics and Gynecology

## 2018-09-03 ENCOUNTER — Encounter: Payer: Self-pay | Admitting: Obstetrics and Gynecology

## 2018-09-03 ENCOUNTER — Other Ambulatory Visit: Payer: Self-pay

## 2018-09-03 VITALS — Ht 61.0 in | Wt 107.0 lb

## 2018-09-03 DIAGNOSIS — F329 Major depressive disorder, single episode, unspecified: Secondary | ICD-10-CM

## 2018-09-03 DIAGNOSIS — N809 Endometriosis, unspecified: Secondary | ICD-10-CM

## 2018-09-03 DIAGNOSIS — N921 Excessive and frequent menstruation with irregular cycle: Secondary | ICD-10-CM

## 2018-09-03 DIAGNOSIS — F419 Anxiety disorder, unspecified: Secondary | ICD-10-CM | POA: Diagnosis not present

## 2018-09-03 DIAGNOSIS — F32A Depression, unspecified: Secondary | ICD-10-CM

## 2018-09-03 NOTE — Progress Notes (Signed)
Pt transferred from the front desk for telephone visit. Pt is having a follow up for abnormal bleeding. Pt stated that her cycles are still not normal and she is still having issues.

## 2018-09-03 NOTE — Progress Notes (Signed)
Virtual Visit via Telephone Note  I connected with Anita Hobbs on 09/03/18 at  1:45 PM EDT by telephone and verified that I am speaking with the correct person using two identifiers.   I discussed the limitations, risks, security and privacy concerns of performing an evaluation and management service by telephone and the availability of in person appointments. I also discussed with the patient that there may be a patient responsible charge related to this service. The patient expressed understanding and agreed to proceed.   History of Present Illness:   Anita Hobbs is a 38 y.o. G0P0000 female with a h/o endometriosis, menorrhagia, and pelvic pain who presents for 1 month follow up. Last visit she was taken off her OCPs due to persistent breakthrough bleeding, and was started on Aygestin.  She reports that she had bleeding starting March 8-14th, then from March 19th-April 7th, followed by bleeding again April 12-17th.  She notes that she then started the pill on April 19th, and had bleeding starting 3 days ago on May 13th.  Bleeding is a little lighter and more manageable.   Of note, patient also states that she has been a lot more "moody and emotional lately".  She states that this has been ongoing for the past several months, but really noticed an increase in intensity of her symptoms last week, which also included anhedonia.  She does have a past history of anxiety and depression (however has never been on medications). She notes that once her cycle came on that the symptoms did improve.  She is unsure if she is beginning to experience PMS on the new medication or if this is unrelated.   Observations/Objective:  Height 5\' 1"  (1.549 m), weight 107 lb (48.5 kg), last menstrual period 09/01/2018. Weight is reported per patient.   Assessment and Plan: 1.  H/o endometriosis with Menorrhagia - advised patient to stop Aygestin for the next several days to allow for likely menses, and then begin  again on Sunday with 10 mg dosing instead of 5 mg.  Will send in new prescription. She should take this for the entire month. Will f/u in 1 month to reassess symptoms again.  3. H/o Depression and anxiety - unsure if current symptoms are related to past history of anxiety and depression, or if mood changes are more hormonal in nature as they seemed to intensify and then decrease surrounding onset of her cycle.  Advised patient to track mood changes along with her next menses.  If symptoms are surrounding cycle, can discuss OTC remedies that can help as patient continues to decline the use of prescription anxiolytics and anti-depressants. Also discussed counseling if it was noted that her symptoms were throughout the cycle.  Will f/u next month. Encouraged to f/u sooner if symptoms worsen, or she begins to experience SI/HI.   Follow Up Instructions: Follow up in 1 month to reassess symptoms. Patient was offered a clinic visit, but would still prefer to do televisit at this time during COVID-19.    I discussed the assessment and treatment plan with the patient. The patient was provided an opportunity to ask questions and all were answered. The patient agreed with the plan and demonstrated an understanding of the instructions.   The patient was advised to call back or seek an in-person evaluation if the symptoms worsen or if the condition fails to improve as anticipated.  I provided 12 minutes of non-face-to-face time during this encounter.   [name], MD  [facility]

## 2018-09-07 ENCOUNTER — Telehealth: Payer: Self-pay | Admitting: Obstetrics and Gynecology

## 2018-09-07 MED ORDER — NORETHINDRONE ACETATE 5 MG PO TABS: 10.0000 mg | ORAL_TABLET | Freq: Every day | ORAL | 2 refills | Status: DC

## 2018-09-07 NOTE — Telephone Encounter (Signed)
The patient called and stated that she has a prescription problem. The patient stated that her medication dosage should be doubled and sent to her pharmacy. Please advise.

## 2018-09-07 NOTE — Telephone Encounter (Signed)
Sorry about that. I thought I had already sent it in. I just submitted it to the pharmacy.   Dr. Marcelline Mates

## 2018-09-07 NOTE — Telephone Encounter (Signed)
Pt was called to see which medication she was talking about. Pt stated that [facility] was going to double or change the dose of the medication aygestin 5 mg. Please advise. Thanks

## 2018-09-09 NOTE — Telephone Encounter (Signed)
Pt called to inform her that Anita Hobbs had sent the pharmacy the changes in her Aygestin 5 mg 2 tablets a day with 60 tablets for a month with 2 refills. Pt stated that she has not heard anything from her pharmacy but would call today to check on the medication.

## 2018-10-04 ENCOUNTER — Telehealth: Payer: Self-pay

## 2018-10-04 NOTE — Telephone Encounter (Signed)
Pt called and stated that she wanted to do a televisit instead of an office visit.

## 2018-10-05 ENCOUNTER — Telehealth: Payer: Self-pay

## 2018-10-05 ENCOUNTER — Encounter: Payer: Self-pay | Admitting: Obstetrics and Gynecology

## 2018-10-05 ENCOUNTER — Ambulatory Visit (INDEPENDENT_AMBULATORY_CARE_PROVIDER_SITE_OTHER): Payer: Managed Care, Other (non HMO) | Admitting: Obstetrics and Gynecology

## 2018-10-05 ENCOUNTER — Other Ambulatory Visit: Payer: Self-pay

## 2018-10-05 VITALS — Ht 61.0 in | Wt 109.0 lb

## 2018-10-05 DIAGNOSIS — N809 Endometriosis, unspecified: Secondary | ICD-10-CM

## 2018-10-05 NOTE — Telephone Encounter (Signed)
Called pt to do televisit.

## 2018-10-05 NOTE — Progress Notes (Signed)
Virtual Visit via Telephone Note  I connected with Anita Hobbs on 10/05/18 at  2:50 PM EDT by telephone and verified that I am speaking with the correct person using two identifiers.   I discussed the limitations, risks, security and privacy concerns of performing an evaluation and management service by telephone and the availability of in person appointments. I also discussed with the patient that there may be a patient responsible charge related to this service. The patient expressed understanding and agreed to proceed.   History of Present Illness:   Anita Hobbs is a 38 y.o. G0P0000 female with a h/o endometriosis, menorrhagia, and pelvic pain who presents for 1 month follow up. She is currently on Agyetsin 10 mg (increased last month from 5 mg due to continued abnormal bleeding).  She notes that on current dose she has not had a menstrual cycle at all, nor has she had any pelvic pain/cramping. Overall doing well, but does report side effect of weight gain.   Observations/Objective:  Height 5\' 1"  (1.549 m), weight 109 lb (49.4 kg), last menstrual period 09/01/2018.  Wt Readings from Last 3 Encounters:  10/05/18 109 lb (49.4 kg)  09/03/18 107 lb (48.5 kg)  08/06/18 104 lb (47.2 kg)    Assessment and Plan:  1. Endometriosis symptoms - Patient currently overall doing well on 10 mg dosing.  Advised to continue use for the next 2 months, and then will attempt to wean back down to 5 mg. Also discussed that if she requires the medication long-term, she may need to do 3 months on/1 week off regimen to prevent breakthrough bleeding, or can discuss alternative methods for management.  Patient notes understanding.   Follow Up Instructions: Will f/u at end of 2 months to reassess symptoms.    I discussed the assessment and treatment plan with the patient. The patient was provided an opportunity to ask questions and all were answered. The patient agreed with the plan and demonstrated an  understanding of the instructions.   The patient was advised to call back or seek an in-person evaluation if the symptoms worsen or if the condition fails to improve as anticipated.  I provided 10 minutes of non-face-to-face time during this encounter.   [name], MD

## 2018-10-05 NOTE — Progress Notes (Signed)
Televisit. Pt called and transferred from front desk. Reviewed and updated medication list.  Pt stated that she was doing well on the medication given at her last visit.

## 2018-11-09 ENCOUNTER — Encounter: Payer: Managed Care, Other (non HMO) | Admitting: Obstetrics and Gynecology

## 2018-11-30 ENCOUNTER — Other Ambulatory Visit: Payer: Self-pay | Admitting: Obstetrics and Gynecology

## 2018-11-30 NOTE — Telephone Encounter (Signed)
I have refilled it.

## 2018-11-30 NOTE — Telephone Encounter (Signed)
Do you want me to refill this pt's meds. Please advise. Thanks [employer]

## 2018-12-07 ENCOUNTER — Ambulatory Visit (INDEPENDENT_AMBULATORY_CARE_PROVIDER_SITE_OTHER): Payer: Managed Care, Other (non HMO) | Admitting: Obstetrics and Gynecology

## 2018-12-07 ENCOUNTER — Encounter: Payer: Self-pay | Admitting: Obstetrics and Gynecology

## 2018-12-07 ENCOUNTER — Other Ambulatory Visit: Payer: Self-pay

## 2018-12-07 VITALS — Ht 61.0 in | Wt 115.0 lb

## 2018-12-07 DIAGNOSIS — N809 Endometriosis, unspecified: Secondary | ICD-10-CM | POA: Diagnosis not present

## 2018-12-07 DIAGNOSIS — N946 Dysmenorrhea, unspecified: Secondary | ICD-10-CM | POA: Diagnosis not present

## 2018-12-07 DIAGNOSIS — N921 Excessive and frequent menstruation with irregular cycle: Secondary | ICD-10-CM | POA: Diagnosis not present

## 2018-12-07 NOTE — Progress Notes (Signed)
Pt televisit. Pt transferred from front desk. Pt's medication and history undated. Pt stated that since she has started medication aygestin she has been doing every well no pain anymore.

## 2018-12-07 NOTE — Progress Notes (Signed)
Virtual Visit via Telephone Note  I connected with Anita Hobbs on 12/07/18 at  1:49 PM EDT by telephone and verified that I am speaking with the correct person using two identifiers.   I discussed the limitations, risks, security and privacy concerns of performing an evaluation and management service by telephone and the availability of in person appointments. I also discussed with the patient that there may be a patient responsible charge related to this service. The patient expressed understanding and agreed to proceed.   History of Present Illness:   Anita Hobbs is a 38 y.o. G0P0000 female with a h/o endometriosis, menorrhagia, and pelvic pain who presents for 1 month follow up. She is currently on Agyetsin 10 mg. She notes that on current dose she has not had a menstrual cycle at all, nor has she had any pelvic pain/cramping. Overall doing well, denies any further weight gain.   Observations/Objective:  There were no vitals taken for this visit.  Reported weight is 115 lbs.  Height is 5'1".   BMI 21.74.   Assessment and Plan:  1. Endometriosis symptoms - Patient currently overall doing well on 10 mg dosing.  Discussed that if she requires the medication long-term, she may need to do 3-4 months on/1 week off regimen to prevent breakthrough bleeding, or can discuss alternative methods for management.  Patient notes understanding.   Follow Up Instructions: Will f/u in 2 months for annual exam.    I discussed the assessment and treatment plan with the patient. The patient was provided an opportunity to ask questions and all were answered. The patient agreed with the plan and demonstrated an understanding of the instructions.   The patient was advised to call back or seek an in-person evaluation if the symptoms worsen or if the condition fails to improve as anticipated.  I provided 11 minutes of non-face-to-face time during this encounter.   [name], MD  [facility]

## 2019-01-21 ENCOUNTER — Other Ambulatory Visit: Payer: Self-pay

## 2019-01-21 ENCOUNTER — Encounter: Payer: Self-pay | Admitting: Obstetrics and Gynecology

## 2019-01-21 ENCOUNTER — Ambulatory Visit (INDEPENDENT_AMBULATORY_CARE_PROVIDER_SITE_OTHER): Payer: Managed Care, Other (non HMO) | Admitting: Obstetrics and Gynecology

## 2019-01-21 VITALS — BP 119/76 | HR 80 | Ht 61.0 in | Wt 118.9 lb

## 2019-01-21 DIAGNOSIS — N809 Endometriosis, unspecified: Secondary | ICD-10-CM | POA: Diagnosis not present

## 2019-01-21 DIAGNOSIS — Z01419 Encounter for gynecological examination (general) (routine) without abnormal findings: Secondary | ICD-10-CM

## 2019-01-21 DIAGNOSIS — N921 Excessive and frequent menstruation with irregular cycle: Secondary | ICD-10-CM | POA: Diagnosis not present

## 2019-01-21 DIAGNOSIS — N946 Dysmenorrhea, unspecified: Secondary | ICD-10-CM | POA: Diagnosis not present

## 2019-01-21 NOTE — Patient Instructions (Signed)
Health Maintenance, Female Adopting a healthy lifestyle and getting preventive care are important in promoting health and wellness. Ask your health care provider about:  The right schedule for you to have regular tests and exams.  Things you can do on your own to prevent diseases and keep yourself healthy. What should I know about diet, weight, and exercise? Eat a healthy diet   Eat a diet that includes plenty of vegetables, fruits, low-fat dairy products, and lean protein.  Do not eat a lot of foods that are high in solid fats, added sugars, or sodium. Maintain a healthy weight Body mass index (BMI) is used to identify weight problems. It estimates body fat based on height and weight. Your health care provider can help determine your BMI and help you achieve or maintain a healthy weight. Get regular exercise Get regular exercise. This is one of the most important things you can do for your health. Most adults should:  Exercise for at least 150 minutes each week. The exercise should increase your heart rate and make you sweat (moderate-intensity exercise).  Do strengthening exercises at least twice a week. This is in addition to the moderate-intensity exercise.  Spend less time sitting. Even light physical activity can be beneficial. Watch cholesterol and blood lipids Have your blood tested for lipids and cholesterol at 38 years of age, then have this test every 5 years. Have your cholesterol levels checked more often if:  Your lipid or cholesterol levels are high.  You are older than 38 years of age.  You are at high risk for heart disease. What should I know about cancer screening? Depending on your health history and family history, you may need to have cancer screening at various ages. This may include screening for:  Breast cancer.  Cervical cancer.  Colorectal cancer.  Skin cancer.  Lung cancer. What should I know about heart disease, diabetes, and high blood  pressure? Blood pressure and heart disease  High blood pressure causes heart disease and increases the risk of stroke. This is more likely to develop in people who have high blood pressure readings, are of African descent, or are overweight.  Have your blood pressure checked: ? Every 3-5 years if you are 18-39 years of age. ? Every year if you are 40 years old or older. Diabetes Have regular diabetes screenings. This checks your fasting blood sugar level. Have the screening done:  Once every three years after age 40 if you are at a normal weight and have a low risk for diabetes.  More often and at a younger age if you are overweight or have a high risk for diabetes. What should I know about preventing infection? Hepatitis B If you have a higher risk for hepatitis B, you should be screened for this virus. Talk with your health care provider to find out if you are at risk for hepatitis B infection. Hepatitis C Testing is recommended for:  Everyone born from 1945 through 1965.  Anyone with known risk factors for hepatitis C. Sexually transmitted infections (STIs)  Get screened for STIs, including gonorrhea and chlamydia, if: ? You are sexually active and are younger than 38 years of age. ? You are older than 38 years of age and your health care provider tells you that you are at risk for this type of infection. ? Your sexual activity has changed since you were last screened, and you are at increased risk for chlamydia or gonorrhea. Ask your health care provider if   you are at risk.  Ask your health care provider about whether you are at high risk for HIV. Your health care provider may recommend a prescription medicine to help prevent HIV infection. If you choose to take medicine to prevent HIV, you should first get tested for HIV. You should then be tested every 3 months for as long as you are taking the medicine. Pregnancy  If you are about to stop having your period (premenopausal) and  you may become pregnant, seek counseling before you get pregnant.  Take 400 to 800 micrograms (mcg) of folic acid every day if you become pregnant.  Ask for birth control (contraception) if you want to prevent pregnancy. Osteoporosis and menopause Osteoporosis is a disease in which the bones lose minerals and strength with aging. This can result in bone fractures. If you are 11 years old or older, or if you are at risk for osteoporosis and fractures, ask your health care provider if you should:  Be screened for bone loss.  Take a calcium or vitamin D supplement to lower your risk of fractures.  Be given hormone replacement therapy (HRT) to treat symptoms of menopause. Follow these instructions at home: Lifestyle  Do not use any products that contain nicotine or tobacco, such as cigarettes, e-cigarettes, and chewing tobacco. If you need help quitting, ask your health care provider.  Do not use street drugs.  Do not share needles.  Ask your health care provider for help if you need support or information about quitting drugs. Alcohol use  Do not drink alcohol if: ? Your health care provider tells you not to drink. ? You are pregnant, may be pregnant, or are planning to become pregnant.  If you drink alcohol: ? Limit how much you use to 0-1 drink a day. ? Limit intake if you are breastfeeding.  Be aware of how much alcohol is in your drink. In the [place] one drink equals one 12 oz bottle of beer (355 mL), one 5 oz glass of wine (148 mL), or one 1 oz glass of hard liquor (44 mL). General instructions  Schedule regular health, dental, and eye exams.  Stay current with your vaccines.  Tell your health care provider if: ? You often feel depressed. ? You have ever been abused or do not feel safe at home. Summary  Adopting a healthy lifestyle and getting preventive care are important in promoting health and wellness.  Follow your health care provider's instructions about healthy  diet, exercising, and getting tested or screened for diseases.  Follow your health care provider's instructions on monitoring your cholesterol and blood pressure. This information is not intended to replace advice given to you by your health care provider. Make sure you discuss any questions you have with your health care provider. Document Released: 10/21/2010 Document Revised: 03/31/2018 Document Reviewed: 03/31/2018 Elsevier Patient Education  2020 [employer].    Smoking Tobacco Information, Adult Smoking tobacco can be harmful to your health. Tobacco contains a poisonous (toxic), colorless chemical called nicotine. Nicotine is addictive. It changes the brain and can make it hard to stop smoking. Tobacco also has other toxic chemicals that can hurt your body and raise your risk of many cancers. How can smoking tobacco affect me? Smoking tobacco puts you at risk for:  Cancer. Smoking is most commonly associated with lung cancer, but can also lead to cancer in other parts of the body.  Chronic obstructive pulmonary disease (COPD). This is a long-term lung condition that makes  it hard to breathe. It also gets worse over time.  High blood pressure (hypertension), heart disease, stroke, or heart attack.  Lung infections, such as pneumonia.  Cataracts. This is when the lenses in the eyes become clouded.  Digestive problems. This may include peptic ulcers, heartburn, and gastroesophageal reflux disease (GERD).  Oral health problems, such as gum disease and tooth loss.  Loss of taste and smell. Smoking can affect your appearance by causing:  Wrinkles.  Yellow or stained teeth, fingers, and fingernails. Smoking tobacco can also affect your social life, because:  It may be challenging to find places to smoke when away from home. Many workplaces, [employer], hotels, and public places are tobacco-free.  Smoking is expensive. This is due to the cost of tobacco and the long-term costs  of treating health problems from smoking.  Secondhand smoke may affect those around you. Secondhand smoke can cause lung cancer, breathing problems, and heart disease. Children of smokers have a higher risk for: ? Sudden infant death syndrome (SIDS). ? Ear infections. ? Lung infections. If you currently smoke tobacco, quitting now can help you:  Lead a longer and healthier life.  Look, smell, breathe, and feel better over time.  Save money.  Protect others from the harms of secondhand smoke. What actions can I take to prevent health problems? Quit smoking   Do not start smoking. Quit if you already do.  Make a plan to quit smoking and commit to it. Look for programs to help you and ask your health care provider for recommendations and ideas.  Set a date and write down all the reasons you want to quit.  Let your friends and family know you are quitting so they can help and support you. Consider finding friends who also want to quit. It can be easier to quit with someone else, so that you can support each other.  Talk with your health care provider about using nicotine replacement medicines to help you quit, such as gum, lozenges, patches, sprays, or pills.  Do not replace cigarette smoking with electronic cigarettes, which are commonly called e-cigarettes. The safety of e-cigarettes is not known, and some may contain harmful chemicals.  If you try to quit but return to smoking, stay positive. It is common to slip up when you first quit, so take it one day at a time.  Be prepared for cravings. When you feel the urge to smoke, chew gum or suck on hard candy. Lifestyle  Stay busy and take care of your body.  Drink enough fluid to keep your urine pale yellow.  Get plenty of exercise and eat a healthy diet. This can help prevent weight gain after quitting.  Monitor your eating habits. Quitting smoking can cause you to have a larger appetite than when you smoke.  Find ways to  relax. Go out with friends or family to a movie or a restaurant where people do not smoke.  Ask your health care provider about having regular tests (screenings) to check for cancer. This may include blood tests, imaging tests, and other tests.  Find ways to manage your stress, such as meditation, yoga, or exercise. Where to find support To get support to quit smoking, consider:  Asking your health care provider for more information and resources.  Taking classes to learn more about quitting smoking.  Looking for local organizations that offer resources about quitting smoking.  Joining a support group for people who want to quit smoking in your local community.  Calling the smokefree.gov counselor helpline: 1-800-Quit-Now 6807652380) Where to find more information You may find more information about quitting smoking from:  HelpGuide.org: www.helpguide.org  https://hall.com/: smokefree.gov  American Lung Association: www.lung.org Contact a health care provider if you:  Have problems breathing.  Notice that your lips, nose, or fingers turn blue.  Have chest pain.  Are coughing up blood.  Feel faint or you pass out.  Have other health changes that cause you to worry. Summary  Smoking tobacco can negatively affect your health, the health of those around you, your finances, and your social life.  Do not start smoking. Quit if you already do. If you need help quitting, ask your health care provider.  Think about joining a support group for people who want to quit smoking in your local community. There are many effective programs that will help you to quit this behavior. This information is not intended to replace advice given to you by your health care provider. Make sure you discuss any questions you have with your health care provider. Document Released: 04/22/2016 Document Revised: 05/27/2017 Document Reviewed: 04/22/2016 Elsevier Patient Education  2020 [employer].

## 2019-01-21 NOTE — Progress Notes (Signed)
Patient comes in today for her yearly physical. No concerns today. Pap abnormal from 01/26/2019.
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
## 2019-01-22 LAB — LIPID PANEL
Chol/HDL Ratio: 8.3 ratio — ABNORMAL HIGH (ref 0.0–4.4)
Cholesterol, Total: 183 mg/dL (ref 100–199)
HDL: 22 mg/dL — ABNORMAL LOW (ref >39)
LDL Chol Calc (NIH): 147 mg/dL — ABNORMAL HIGH (ref 0–99)
Triglycerides: 77 mg/dL (ref 0–149)
VLDL Cholesterol Cal: 14 mg/dL (ref 5–40)

## 2019-01-22 LAB — COMPREHENSIVE METABOLIC PANEL
ALT: 18 IU/L (ref 0–32)
AST: 15 IU/L (ref 0–40)
Albumin/Globulin Ratio: 1.6 (ref 1.2–2.2)
Albumin: 4.1 g/dL (ref 3.8–4.8)
Alkaline Phosphatase: 64 IU/L (ref 39–117)
BUN/Creatinine Ratio: 11 (ref 9–23)
BUN: 8 mg/dL (ref 6–20)
Bilirubin Total: <0.2 mg/dL (ref 0.0–1.2)
CO2: 22 mmol/L (ref 20–29)
Calcium: 9 mg/dL (ref 8.7–10.2)
Chloride: 102 mmol/L (ref 96–106)
Creatinine, Ser: 0.73 mg/dL (ref 0.57–1.00)
GFR calc Af Amer: 121 mL/min/{1.73_m2} (ref >59)
GFR calc non Af Amer: 105 mL/min/{1.73_m2} (ref >59)
Globulin, Total: 2.5 g/dL (ref 1.5–4.5)
Glucose: 77 mg/dL (ref 65–99)
Potassium: 4.7 mmol/L (ref 3.5–5.2)
Sodium: 137 mmol/L (ref 134–144)
Total Protein: 6.6 g/dL (ref 6.0–8.5)

## 2019-01-22 LAB — CBC
Hematocrit: 35.6 % (ref 34.0–46.6)
Hemoglobin: 11.8 g/dL (ref 11.1–15.9)
MCH: 27.4 pg (ref 26.6–33.0)
MCHC: 33.1 g/dL (ref 31.5–35.7)
MCV: 83 fL (ref 79–97)
Platelets: 474 10*3/uL — ABNORMAL HIGH (ref 150–450)
RBC: 4.3 x10E6/uL (ref 3.77–5.28)
RDW: 14.7 % (ref 11.7–15.4)
WBC: 7.9 10*3/uL (ref 3.4–10.8)

## 2019-01-22 LAB — TSH: TSH: 1.41 u[IU]/mL (ref 0.450–4.500)

## 2019-01-28 ENCOUNTER — Telehealth: Payer: Self-pay | Admitting: Obstetrics and Gynecology

## 2019-01-28 DIAGNOSIS — E7800 Pure hypercholesterolemia, unspecified: Secondary | ICD-10-CM

## 2019-01-28 NOTE — Telephone Encounter (Signed)
The patient called and stated that she missed a call from our office. Pt is requesting a call back. Please advise.

## 2019-01-28 NOTE — Telephone Encounter (Signed)
Pt called and went over test results from her annual exam. Pt is aware that her cholesterol is evaluated and a referral for a PCP has been placed.

## 2019-02-28 ENCOUNTER — Other Ambulatory Visit: Payer: Self-pay | Admitting: Obstetrics and Gynecology

## 2019-05-03 ENCOUNTER — Other Ambulatory Visit: Payer: Self-pay

## 2019-05-03 ENCOUNTER — Ambulatory Visit (INDEPENDENT_AMBULATORY_CARE_PROVIDER_SITE_OTHER): Payer: Managed Care, Other (non HMO) | Admitting: Internal Medicine

## 2019-05-03 ENCOUNTER — Encounter: Payer: Self-pay | Admitting: Internal Medicine

## 2019-05-03 VITALS — Ht 60.0 in | Wt 118.0 lb

## 2019-05-03 DIAGNOSIS — F329 Major depressive disorder, single episode, unspecified: Secondary | ICD-10-CM

## 2019-05-03 DIAGNOSIS — F419 Anxiety disorder, unspecified: Secondary | ICD-10-CM

## 2019-05-03 DIAGNOSIS — D473 Essential (hemorrhagic) thrombocythemia: Secondary | ICD-10-CM | POA: Diagnosis not present

## 2019-05-03 DIAGNOSIS — D75839 Thrombocytosis, unspecified: Secondary | ICD-10-CM | POA: Insufficient documentation

## 2019-05-03 DIAGNOSIS — E559 Vitamin D deficiency, unspecified: Secondary | ICD-10-CM

## 2019-05-03 DIAGNOSIS — Z720 Tobacco use: Secondary | ICD-10-CM

## 2019-05-03 DIAGNOSIS — Z1389 Encounter for screening for other disorder: Secondary | ICD-10-CM

## 2019-05-03 DIAGNOSIS — E785 Hyperlipidemia, unspecified: Secondary | ICD-10-CM | POA: Diagnosis not present

## 2019-05-03 NOTE — Progress Notes (Signed)
Virtual Visit via Video Note  I connected with Anita Hobbs  on 05/03/19 at  315 PM EST by a video enabled telemedicine application and verified that I am speaking with the correct person using two identifiers.  Location patient: car Location provider:work or home office Persons participating in the virtual visit: patient, provider  I discussed the limitations of evaluation and management by telemedicine and the availability of in person appointments. The patient expressed understanding and agreed to proceed.   HPI: 1. Labs 01/2019 with elevated LDL 147 and low HDL 22 reviewed labs she thinks this is related to diet as she eats on the go working at [employer] 2. Tobacco abuse since age 39 y.o 1/2 to 1ppd 3. Anxiety and depression history not currently on meds  4. Endometriosis f/u ob/gyn Dr. Marcelline Mates h/o abnormal pap 01/2018 negative +HPV will CC ob/gyn to see when due to f/u    ROS: See pertinent positives and negatives per HPI. General:wt stable HEENT: nl hearing  CV: no chest pain  Lungs: no sob  GI: no ab pain  MSK: no jt pain  Neuro: no h/a  Skin: mole to back, not chaning been there since kid Psych: stable  GU: endometriosis+ stable  Past Medical History:  Diagnosis Date  . Anxiety   . Depression   . Dysmenorrhea   . Endometriosis   . History of substance abuse ([place])    Percocet  . HLD (hyperlipidemia)   . Infertility, female   . Menorrhagia   . Vitamin D deficiency     Past Surgical History:  Procedure Laterality Date  . CYSTECTOMY     @ age 39yrs, rt ovary     Family History  Problem Relation Age of Onset  . Obesity Mother   . Depression Mother   . Mental illness Mother   . Diabetes Mother   . Hypertension Mother   . Endometriosis Other   . Skin cancer Other   . Miscarriages / Stillbirths Other   . Breast cancer Other        m. 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  norethindrone (AYGESTIN) 5 MG tablet, TAKE 2 TABLETS BY MOUTH EVERY DAY, Disp: 60 tablet, Rfl: 6  EXAM:  VITALS per patient if applicable:  GENERAL: alert, oriented, appears well and in no acute distress  HEENT: atraumatic, conjunttiva clear, no obvious abnormalities on inspection of external nose and ears  NECK: normal movements of the head and neck  LUNGS: on inspection no signs of respiratory distress, breathing rate appears normal, no obvious gross SOB, gasping or wheezing  CV: no obvious cyanosis  MS: moves all visible extremities without noticeable abnormality  PSYCH/NEURO: pleasant and cooperative, no obvious depression or anxiety, speech and thought processing grossly intact  ASSESSMENT AND PLAN:  Discussed the following assessment and plan:  Hyperlipidemia, unspecified hyperlipidemia type Given cholesterol info  Repeat labs with [facility] fasting   Tobacco abuse rec cessation   Thrombocytosis ([facility]) Likely reactive  Pt declines HIV/hep C check  rec smoking cessation  If persistent refer to h/o further w/u   Anxiety and depression Mood stable ntd   HM Declines flu shot and covid 19 vx for not  Consider Tdap per pt may have had in 2015  rec smoking cessation smoker since age 39 y.o 1/2 to 1ppd  Pap 01/2018 +  HPV will CC Dr. Marcelline Mates when due to f/u I thought this was yearly  mammo age 39  Colonoscopy age 39  Skin mole on back check at f/u in person been there since a child raised but not changing.  Declines HIV and hep C check   -we discussed possible serious and likely etiologies, options for evaluation and workup, limitations of telemedicine visit vs in person visit, treatment, treatment risks and precautions. Pt prefers to treat via telemedicine empirically rather then risking or undertaking an in person visit at this moment. Patient agrees to seek prompt in person care if worsening, new symptoms arise, or if is not  improving with treatment.   I discussed the assessment and treatment plan with the patient. The patient was provided an opportunity to ask questions and all were answered. The patient agreed with the plan and demonstrated an understanding of the instructions.   The patient was advised to call back or seek an in-person evaluation if the symptoms worsen or if the condition fails to improve as anticipated.  Time spent 30 minutes  Delorise Jackson, MD

## 2019-05-03 NOTE — Patient Instructions (Addendum)
Consider Tdap vaccine in our office if unclear about the date  rec smoking cessation/quit call [phone] quit now   Budget-Friendly Healthy Eating There are many ways to save money at the grocery store and continue to eat healthy. You can be successful if you:  Plan meals according to your budget.  Make a grocery list and only purchase food according to your grocery list.  Prepare food yourself. What are tips for following this plan?  Reading food labels  Compare food labels between brand name foods and the store brand. Often the nutritional value is the same, but the store brand is lower cost.  Look for products that do not have added sugar, fat, or salt (sodium). These often cost the same but are healthier for you. Products may be labeled as: ? Sugar-free. ? Nonfat. ? Low-fat. ? Sodium-free. ? Low-sodium.  Look for lean ground beef labeled as at least 92% lean and 8% fat. Shopping  Buy only the items on your grocery list and go only to the areas of the store that have the items on your list.  Use coupons only for foods and brands you normally buy. Avoid buying items you wouldn't normally buy simply because they are on sale.  Check online and in newspapers for weekly deals.  Buy healthy items from the bulk bins when available, such as herbs, spices, flour, pasta, nuts, and dried fruit.  Buy fruits and vegetables that are in season. Prices are usually lower on in-season produce.  Look at the unit price on the price tag. Use it to compare different brands and sizes to find out which item is the best deal.  Choose healthy items that are often low-cost, such as carrots, potatoes, apples, bananas, and oranges. Dried or canned beans are a low-cost protein source.  Buy in bulk and freeze extra food. Items you can buy in bulk include meats, fish, poultry, frozen fruits, and frozen vegetables.  Avoid buying "ready-to-eat" foods, such as pre-cut fruits and vegetables and pre-made  salads.  If possible, shop around to discover where you can find the best prices. Consider other retailers such as dollar stores, larger [employer], local fruit and vegetable stands, and farmers markets.  Do not shop when you are hungry. If you shop while hungry, it may be hard to stick to your list and budget.  Resist impulse buying. Use your grocery list as your official plan for the week.  Buy a variety of vegetables and fruits by purchasing fresh, frozen, and canned items.  Look at the top and bottom shelves for deals. Foods at eye level (eye level of an adult or child) are usually more expensive.  Be efficient with your time when shopping. The more time you spend at the store, the more money you are likely to spend.  To save money when choosing more expensive foods like meats and dairy: ? Choose cheaper cuts of meat, such as bone-in chicken thighs and drumsticks instead of skinless and boneless chicken. When you are ready to prepare the chicken, you can remove the skin yourself to make it healthier. ? Choose lean meats like chicken or [place] instead of beef. ? Choose canned seafood, such as tuna, salmon, or sardines. ? Buy eggs as a low-cost source of protein. ? Buy dried beans and peas, such as lentils, split peas, or kidney beans instead of meats. Dried beans and peas are a good alternative source of protein. ? Buy the larger tubs of yogurt instead of  individual-sized containers.  Choose water instead of sodas and other sweetened beverages.  Avoid buying chips, cookies, and other "junk food." These items are usually expensive and not healthy. Cooking  Make extra food and freeze the extras in meal-sized containers or in individual portions for fast meals and snacks.  Pre-cook on days when you have extra time to prepare meals in advance. You can keep these meals in the fridge or freezer and reheat for a quick meal.  When you come home from the grocery store, wash, peel, and  cut fruits and vegetables so they are ready to use and eat. This will help reduce food waste. Meal planning  Do not eat out or get fast food. Prepare food at home.  Make a grocery list and make sure to bring it with you to the store. If you have a smart phone, you could use your phone to create your shopping list.  Plan meals and snacks according to a grocery list and budget you create.  Use leftovers in your meal plan for the week.  Look for recipes where you can cook once and make enough food for two meals.  Include budget-friendly meals like stews, casseroles, and stir-fry dishes.  Try some meatless meals or try "no cook" meals like salads.  Make sure that half your plate is filled with fruits or vegetables. Choose from fresh, frozen, or canned fruits and vegetables. If eating canned, remember to rinse them before eating. This will remove any excess salt added for packaging. Summary  Eating healthy on a budget is possible if you plan your meals according to your budget, purchase according to your budget and grocery list, and prepare food yourself.  Tips for buying more food on a limited budget include buying generic brands, using coupons only for foods you normally buy, and buying healthy items from the bulk bins when available.  Tips for buying cheaper food to replace expensive food include choosing cheaper, lean cuts of meat, and buying dried beans and peas. This information is not intended to replace advice given to you by your health care provider. Make sure you discuss any questions you have with your health care provider. Document Revised: 04/08/2017 Document Reviewed: 04/08/2017 Elsevier Patient Education  [place].  Cholesterol Content in Foods Cholesterol is a waxy, fat-like substance that helps to carry fat in the blood. The body needs cholesterol in small amounts, but too much cholesterol can cause damage to the arteries and heart. Most people should eat less  than 200 milligrams (mg) of cholesterol a day. Foods with cholesterol  Cholesterol is found in animal-based foods, such as meat, seafood, and dairy. Generally, low-fat dairy and lean meats have less cholesterol than full-fat dairy and fatty meats. The milligrams of cholesterol per serving (mg per serving) of common cholesterol-containing foods are listed below. Meat and other proteins  Egg -- one large whole egg has 186 mg.  Veal shank -- 4 oz has 141 mg.  Lean ground [place] (93% lean) -- 4 oz has 118 mg.  Fat-trimmed lamb loin -- 4 oz has 106 mg.  Lean ground beef (90% lean) -- 4 oz has 100 mg.  Lobster -- 3.5 oz has 90 mg.  Pork loin chops -- 4 oz has 86 mg.  Canned salmon -- 3.5 oz has 83 mg.  Fat-trimmed beef top loin -- 4 oz has 78 mg.  Frankfurter -- 1 frank (3.5 oz) has 77 mg.  Crab -- 3.5 oz has 71 mg.  Roasted chicken without skin, white meat -- 4 oz has 66 mg.  Light bologna -- 2 oz has 45 mg.  Deli-cut [place] -- 2 oz has 31 mg.  Canned tuna -- 3.5 oz has 31 mg.  Berniece Salines -- 1 oz has 29 mg.  Oysters and mussels (raw) -- 3.5 oz has 25 mg.  Mackerel -- 1 oz has 22 mg.  Trout -- 1 oz has 20 mg.  Pork sausage -- 1 link (1 oz) has 17 mg.  Salmon -- 1 oz has 16 mg.  Tilapia -- 1 oz has 14 mg. Dairy  Soft-serve ice cream --  cup (4 oz) has 103 mg.  Whole-milk yogurt -- 1 cup (8 oz) has 29 mg.  Cheddar cheese -- 1 oz has 28 mg.  American cheese -- 1 oz has 28 mg.  Whole milk -- 1 cup (8 oz) has 23 mg.  2% milk -- 1 cup (8 oz) has 18 mg.  Cream cheese -- 1 tablespoon (Tbsp) has 15 mg.  Cottage cheese --  cup (4 oz) has 14 mg.  Low-fat (1%) milk -- 1 cup (8 oz) has 10 mg.  Sour cream -- 1 Tbsp has 8.5 mg.  Low-fat yogurt -- 1 cup (8 oz) has 8 mg.  Nonfat Greek yogurt -- 1 cup (8 oz) has 7 mg.  Half-and-half cream -- 1 Tbsp has 5 mg. Fats and oils  Cod liver oil -- 1 tablespoon (Tbsp) has 82 mg.  Butter -- 1 Tbsp has 15 mg.  Lard -- 1  Tbsp has 14 mg.  Bacon grease -- 1 Tbsp has 14 mg.  Mayonnaise -- 1 Tbsp has 5-10 mg.  Margarine -- 1 Tbsp has 3-10 mg. Exact amounts of cholesterol in these foods may vary depending on specific ingredients and brands. Foods without cholesterol Most plant-based foods do not have cholesterol unless you combine them with a food that has cholesterol. Foods without cholesterol include:  Grains and cereals.  Vegetables.  Fruits.  Vegetable oils, such as olive, canola, and sunflower oil.  Legumes, such as peas, beans, and lentils.  Nuts and seeds.  Egg whites. Summary  The body needs cholesterol in small amounts, but too much cholesterol can cause damage to the arteries and heart.  Most people should eat less than 200 milligrams (mg) of cholesterol a day. This information is not intended to replace advice given to you by your health care provider. Make sure you discuss any questions you have with your health care provider. Document Revised: 03/20/2017 Document Reviewed: 12/02/2016 Elsevier Patient Education  [place].   High Cholesterol  High cholesterol is a condition in which the blood has high levels of a white, waxy, fat-like substance (cholesterol). The human body needs small amounts of cholesterol. The liver makes all the cholesterol that the body needs. Extra (excess) cholesterol comes from the food that we eat. Cholesterol is carried from the liver by the blood through the blood vessels. If you have high cholesterol, deposits (plaques) may build up on the walls of your blood vessels (arteries). Plaques make the arteries narrower and stiffer. Cholesterol plaques increase your risk for heart attack and stroke. Work with your health care provider to keep your cholesterol levels in a healthy range. What increases the risk? This condition is more likely to develop in people who:  Eat foods that are high in animal fat (saturated fat) or cholesterol.  Are  overweight.  Are not getting enough exercise.  Have a family  history of high cholesterol. What are the signs or symptoms? There are no symptoms of this condition. How is this diagnosed? This condition may be diagnosed from the results of a blood test.  If you are older than age 88, your health care provider may check your cholesterol every 4-6 years.  You may be checked more often if you already have high cholesterol or other risk factors for heart disease. The blood test for cholesterol measures:  "Bad" cholesterol (LDL cholesterol). This is the main type of cholesterol that causes heart disease. The desired level for LDL is less than 100.  "Good" cholesterol (HDL cholesterol). This type helps to protect against heart disease by cleaning the arteries and carrying the LDL away. The desired level for HDL is 60 or higher.  Triglycerides. These are fats that the body can store or burn for energy. The desired number for triglycerides is lower than 150.  Total cholesterol. This is a measure of the total amount of cholesterol in your blood, including LDL cholesterol, HDL cholesterol, and triglycerides. A healthy number is less than 200. How is this treated? This condition is treated with diet changes, lifestyle changes, and medicines. Diet changes  This may include eating more whole grains, fruits, vegetables, nuts, and fish.  This may also include cutting back on red meat and foods that have a lot of added sugar. Lifestyle changes  Changes may include getting at least 40 minutes of aerobic exercise 3 times a week. Aerobic exercises include walking, biking, and swimming. Aerobic exercise along with a healthy diet can help you maintain a healthy weight.  Changes may also include quitting smoking. Medicines  Medicines are usually given if diet and lifestyle changes have failed to reduce your cholesterol to healthy levels.  Your health care provider may prescribe a statin medicine.  Statin medicines have been shown to reduce cholesterol, which can reduce the risk of heart disease. Follow these instructions at home: Eating and drinking If told by your health care provider:  Eat chicken (without skin), fish, veal, shellfish, ground [place] breast, and round or loin cuts of red meat.  Do not eat fried foods or fatty meats, such as hot dogs and salami.  Eat plenty of fruits, such as apples.  Eat plenty of vegetables, such as broccoli, potatoes, and carrots.  Eat beans, peas, and lentils.  Eat grains such as barley, rice, couscous, and bulgur wheat.  Eat pasta without cream sauces.  Use skim or nonfat milk, and eat low-fat or nonfat yogurt and cheeses.  Do not eat or drink whole milk, cream, ice cream, egg yolks, or hard cheeses.  Do not eat stick margarine or tub margarines that contain trans fats (also called partially hydrogenated oils).  Do not eat saturated tropical oils, such as coconut oil and palm oil.  Do not eat cakes, cookies, crackers, or other baked goods that contain trans fats.  General instructions  Exercise as directed by your health care provider. Increase your activity level with activities such as gardening, walking, and taking the stairs.  Take over-the-counter and prescription medicines only as told by your health care provider.  Do not use any products that contain nicotine or tobacco, such as cigarettes and e-cigarettes. If you need help quitting, ask your health care provider.  Keep all follow-up visits as told by your health care provider. This is important. Contact a health care provider if:  You are struggling to maintain a healthy diet or weight.  You need help to  start on an exercise program.  You need help to stop smoking. Get help right away if:  You have chest pain.  You have trouble breathing. This information is not intended to replace advice given to you by your health care provider. Make sure you discuss any  questions you have with your health care provider. Document Revised: 04/10/2017 Document Reviewed: 10/06/2015 Elsevier Patient Education  [place].  https://www.cdc.gov/vaccines/hcp/vis/vis-statements/tdap.pdf">  Tdap (Tetanus, Diphtheria, Pertussis) Vaccine: What You Need to Know 1. Why get vaccinated? Tdap vaccine can prevent tetanus, diphtheria, and pertussis. Diphtheria and pertussis spread from person to person. Tetanus enters the body through cuts or wounds.  TETANUS (T) causes painful stiffening of the muscles. Tetanus can lead to serious health problems, including being unable to open the mouth, having trouble swallowing and breathing, or death.  DIPHTHERIA (D) can lead to difficulty breathing, heart failure, paralysis, or death.  PERTUSSIS (aP), also known as "whooping cough," can cause uncontrollable, violent coughing which makes it hard to breathe, eat, or drink. Pertussis can be extremely serious in babies and young children, causing pneumonia, convulsions, brain damage, or death. In teens and adults, it can cause weight loss, loss of bladder control, passing out, and rib fractures from severe coughing. 2. Tdap vaccine Tdap is only for children 7 years and older, adolescents, and adults.  Adolescents should receive a single dose of Tdap, preferably at age 41 or 50 years. Pregnant women should get a dose of Tdap during every pregnancy, to protect the newborn from pertussis. Infants are most at risk for severe, life-threatening complications from pertussis. Adults who have never received Tdap should get a dose of Tdap. Also, adults should receive a booster dose every 10 years, or earlier in the case of a severe and dirty wound or burn. Booster doses can be either Tdap or Td (a different vaccine that protects against tetanus and diphtheria but not pertussis). Tdap may be given at the same time as other vaccines. 3. Talk with your health care provider Tell your vaccine  provider if the person getting the vaccine:  Has had an allergic reaction after a previous dose of any vaccine that protects against tetanus, diphtheria, or pertussis, or has any severe, life-threatening allergies.  Has had a coma, decreased level of consciousness, or prolonged seizures within 7 days after a previous dose of any pertussis vaccine (DTP, DTaP, or Tdap).  Has seizures or another nervous system problem.  Has ever had Guillain-Barr Syndrome (also called GBS).  Has had severe pain or swelling after a previous dose of any vaccine that protects against tetanus or diphtheria. In some cases, your health care provider may decide to postpone Tdap vaccination to a future visit.  People with minor illnesses, such as a cold, may be vaccinated. People who are moderately or severely ill should usually wait until they recover before getting Tdap vaccine.  Your health care provider can give you more information. 4. Risks of a vaccine reaction  Pain, redness, or swelling where the shot was given, mild fever, headache, feeling tired, and nausea, vomiting, diarrhea, or stomachache sometimes happen after Tdap vaccine. People sometimes faint after medical procedures, including vaccination. Tell your provider if you feel dizzy or have vision changes or ringing in the ears.  As with any medicine, there is a very remote chance of a vaccine causing a severe allergic reaction, other serious injury, or death. 5. What if there is a serious problem? An allergic reaction could occur after the vaccinated person  leaves the clinic. If you see signs of a severe allergic reaction (hives, swelling of the face and throat, difficulty breathing, a fast heartbeat, dizziness, or weakness), call 9-1-1 and get the person to the nearest hospital. For other signs that concern you, call your health care provider.  Adverse reactions should be reported to the Vaccine Adverse Event Reporting System (VAERS). Your health care  provider will usually file this report, or you can do it yourself. Visit the VAERS website at www.vaers.SamedayNews.es or call 423-241-2011. VAERS is only for reporting reactions, and VAERS staff do not give medical advice. 6. The National Vaccine Injury Compensation Program The [employer] Vaccine Injury Compensation Program (VICP) is a federal program that was created to compensate people who may have been injured by certain vaccines. Visit the VICP website at GoldCloset.com.ee or call 708 843 1976 to learn about the program and about filing a claim. There is a time limit to file a claim for compensation. 7. How can I learn more?  Ask your health care provider.  Call your local or state health department.  Contact the Centers for Disease Control and Prevention (CDC): ? Call 7404986727 (1-800-CDC-INFO) or ? Visit CDC's website at http://hunter.com/ Vaccine Information Statement Tdap (Tetanus, Diphtheria, Pertussis) Vaccine (07/21/2018) This information is not intended to replace advice given to you by your health care provider. Make sure you discuss any questions you have with your health care provider. Document Revised: 07/30/2018 Document Reviewed: 08/02/2018 Elsevier Patient Education  [place].

## 2019-05-08 ENCOUNTER — Encounter: Payer: Self-pay | Admitting: Internal Medicine

## 2019-05-11 ENCOUNTER — Other Ambulatory Visit: Payer: Self-pay | Admitting: Internal Medicine

## 2019-05-11 DIAGNOSIS — E559 Vitamin D deficiency, unspecified: Secondary | ICD-10-CM

## 2019-05-11 DIAGNOSIS — R739 Hyperglycemia, unspecified: Secondary | ICD-10-CM

## 2019-05-11 LAB — URINALYSIS, ROUTINE W REFLEX MICROSCOPIC
Bilirubin, UA: NEGATIVE
Glucose, UA: NEGATIVE
Ketones, UA: NEGATIVE
Leukocytes,UA: NEGATIVE
Nitrite, UA: NEGATIVE
Protein,UA: NEGATIVE
RBC, UA: NEGATIVE
Specific Gravity, UA: 1.016 (ref 1.005–1.030)
Urobilinogen, Ur: 0.2 mg/dL (ref 0.2–1.0)
pH, UA: 6.5 (ref 5.0–7.5)

## 2019-05-11 LAB — CBC WITH DIFFERENTIAL/PLATELET
Basophils Absolute: 0.1 10*3/uL (ref 0.0–0.2)
Basos: 1 %
EOS (ABSOLUTE): 0.1 10*3/uL (ref 0.0–0.4)
Eos: 2 %
Hematocrit: 40.2 % (ref 34.0–46.6)
Hemoglobin: 12.7 g/dL (ref 11.1–15.9)
Immature Grans (Abs): 0 10*3/uL (ref 0.0–0.1)
Immature Granulocytes: 0 %
Lymphocytes Absolute: 2.6 10*3/uL (ref 0.7–3.1)
Lymphs: 39 %
MCH: 26.5 pg — ABNORMAL LOW (ref 26.6–33.0)
MCHC: 31.6 g/dL (ref 31.5–35.7)
MCV: 84 fL (ref 79–97)
Monocytes Absolute: 0.5 10*3/uL (ref 0.1–0.9)
Monocytes: 8 %
Neutrophils Absolute: 3.4 10*3/uL (ref 1.4–7.0)
Neutrophils: 50 %
Platelets: 476 10*3/uL — ABNORMAL HIGH (ref 150–450)
RBC: 4.8 x10E6/uL (ref 3.77–5.28)
RDW: 15 % (ref 11.7–15.4)
WBC: 6.7 10*3/uL (ref 3.4–10.8)

## 2019-05-11 LAB — COMPREHENSIVE METABOLIC PANEL
ALT: 24 IU/L (ref 0–32)
AST: 22 IU/L (ref 0–40)
Albumin/Globulin Ratio: 1.6 (ref 1.2–2.2)
Albumin: 4.1 g/dL (ref 3.8–4.8)
Alkaline Phosphatase: 67 IU/L (ref 39–117)
BUN/Creatinine Ratio: 6 — ABNORMAL LOW (ref 9–23)
BUN: 5 mg/dL — ABNORMAL LOW (ref 6–20)
Bilirubin Total: <0.2 mg/dL (ref 0.0–1.2)
CO2: 24 mmol/L (ref 20–29)
Calcium: 9.4 mg/dL (ref 8.7–10.2)
Chloride: 100 mmol/L (ref 96–106)
Creatinine, Ser: 0.78 mg/dL (ref 0.57–1.00)
GFR calc Af Amer: 112 mL/min/{1.73_m2} (ref >59)
GFR calc non Af Amer: 97 mL/min/{1.73_m2} (ref >59)
Globulin, Total: 2.5 g/dL (ref 1.5–4.5)
Glucose: 117 mg/dL — ABNORMAL HIGH (ref 65–99)
Potassium: 4.7 mmol/L (ref 3.5–5.2)
Sodium: 136 mmol/L (ref 134–144)
Total Protein: 6.6 g/dL (ref 6.0–8.5)

## 2019-05-11 LAB — LIPID PANEL
Chol/HDL Ratio: 9.9 ratio — ABNORMAL HIGH (ref 0.0–4.4)
Cholesterol, Total: 189 mg/dL (ref 100–199)
HDL: 19 mg/dL — ABNORMAL LOW (ref >39)
LDL Chol Calc (NIH): 146 mg/dL — ABNORMAL HIGH (ref 0–99)
Triglycerides: 130 mg/dL (ref 0–149)
VLDL Cholesterol Cal: 24 mg/dL (ref 5–40)

## 2019-05-11 LAB — VITAMIN D 25 HYDROXY (VIT D DEFICIENCY, FRACTURES): Vit D, 25-Hydroxy: 16.5 ng/mL — ABNORMAL LOW (ref 30.0–100.0)

## 2019-05-11 MED ORDER — CHOLECALCIFEROL 1.25 MG (50000 UT) PO CAPS: 50000.0000 [IU] | ORAL_CAPSULE | ORAL | 1 refills | Status: DC

## 2019-05-12 ENCOUNTER — Other Ambulatory Visit: Payer: Self-pay | Admitting: Internal Medicine

## 2019-05-12 ENCOUNTER — Encounter: Payer: Self-pay | Admitting: Internal Medicine

## 2019-05-12 DIAGNOSIS — D473 Essential (hemorrhagic) thrombocythemia: Secondary | ICD-10-CM

## 2019-05-12 DIAGNOSIS — D75839 Thrombocytosis, unspecified: Secondary | ICD-10-CM

## 2019-05-13 LAB — SPECIMEN STATUS REPORT

## 2019-05-13 LAB — HEMOGLOBIN A1C
Est. average glucose Bld gHb Est-mCnc: 91 mg/dL
Hgb A1c MFr Bld: 4.8 % (ref 4.8–5.6)

## 2019-05-17 ENCOUNTER — Inpatient Hospital Stay: Payer: Managed Care, Other (non HMO)

## 2019-05-17 ENCOUNTER — Other Ambulatory Visit: Payer: Self-pay

## 2019-05-17 ENCOUNTER — Inpatient Hospital Stay: Payer: Managed Care, Other (non HMO) | Attending: Oncology | Admitting: Oncology

## 2019-05-17 ENCOUNTER — Encounter: Payer: Self-pay | Admitting: Oncology

## 2019-05-17 VITALS — BP 121/60 | HR 73 | Temp 98.3°F | Ht 60.0 in | Wt 120.0 lb

## 2019-05-17 DIAGNOSIS — D75839 Thrombocytosis, unspecified: Secondary | ICD-10-CM

## 2019-05-17 DIAGNOSIS — D473 Essential (hemorrhagic) thrombocythemia: Secondary | ICD-10-CM | POA: Diagnosis present

## 2019-05-17 DIAGNOSIS — F17210 Nicotine dependence, cigarettes, uncomplicated: Secondary | ICD-10-CM | POA: Diagnosis not present

## 2019-05-17 NOTE — Progress Notes (Signed)
Patient is here today to establish care for thrombocytosis.

## 2019-05-18 ENCOUNTER — Encounter: Payer: Self-pay | Admitting: Oncology

## 2019-05-19 ENCOUNTER — Encounter: Payer: Self-pay | Admitting: Oncology

## 2019-05-20 ENCOUNTER — Encounter: Payer: Self-pay | Admitting: Oncology

## 2019-05-20 NOTE — Progress Notes (Signed)
Hematology/Oncology Consult note [facility] [phone] [phone]  Patient Care Team: McLean-Scocuzza, Nino Glow, MD as PCP - General (Internal Medicine) [name], MD as Referring Physician (Obstetrics and Gynecology)   Name of the patient: Anita Hobbs  [id number]  Apr 07, 1981    Reason for referral- thrombocytosis   Referring physician-Dr. Mclean-Scocuzza  Date of visit: 05/20/19   History of presenting illness-patient is a 39 year old female referred to [place] for thrombocytosis.Patient was noted to have platelet count of 208 in 2017.  Platelet count was 474 in October 2020 and 476 on 05/10/2019.  White count and hemoglobin have been normal.  MCV normal.  She is a chronic smoker.  She denies any prior history of thromboembolic episodes.  Denies any changes in her appetite or unintentional weight loss or night sweats.  Denies any recurrent infections or hospitalizations.  ECOG PS- 0  Pain scale- 0   Review of systems- Review of Systems  Constitutional: Negative for chills, fever, malaise/fatigue and weight loss.  HENT: Negative for congestion, ear discharge and nosebleeds.   Eyes: Negative for blurred vision.  Respiratory: Negative for cough, hemoptysis, sputum production, shortness of breath and wheezing.   Cardiovascular: Negative for chest pain, palpitations, orthopnea and claudication.  Gastrointestinal: Negative for abdominal pain, blood in stool, constipation, diarrhea, heartburn, melena, nausea and vomiting.  Genitourinary: Negative for dysuria, flank pain, frequency, hematuria and urgency.  Musculoskeletal: Negative for back pain, joint pain and myalgias.  Skin: Negative for rash.  Neurological: Negative for dizziness, tingling, focal weakness, seizures, weakness and headaches.  Endo/Heme/Allergies: Does not bruise/bleed easily.  Psychiatric/Behavioral: Negative for depression and suicidal ideas. The patient does not have  insomnia.     No Known Allergies  Patient Active Problem List   Diagnosis Date Noted  . Tobacco abuse 05/03/2019  . Anxiety and depression 05/03/2019  . Thrombocytosis ([place]) 05/03/2019  . HLD (hyperlipidemia)   . Menorrhagia with irregular cycle 12/07/2018  . Dysmenorrhea 01/25/2018  . Endometriosis 01/25/2018     Past Medical History:  Diagnosis Date  . Anxiety   . Depression   . Dysmenorrhea   . Endometriosis   . History of substance abuse ([place])    Percocet  . HLD (hyperlipidemia)   . Infertility, female   . Menorrhagia   . Vitamin D deficiency      Past Surgical History:  Procedure Laterality Date  . CYSTECTOMY     @ age 41yr, rt ovary     Social History   Socioeconomic History  . Marital status: Married    Spouse name: Not on file  . Number of children: Not on file  . Years of education: Not on file  . Highest education level: Not on file  Occupational History  . Not on file  Tobacco Use  . Smoking status: Current Every Day Smoker    Packs/day: 0.50    Years: 25.00    Pack years: 12.50    Types: Cigarettes  . Smokeless tobacco: Never Used  Substance and Sexual Activity  . Alcohol use: Yes    Comment: Occasionally   . Drug use: Not Currently    Comment: history of substance abuse (Percocet)  . Sexual activity: Yes    Birth control/protection: None, Pill  Other Topics Concern  . Not on file  Social History Narrative   [employer] billing   Dogs    Smoker    DPR DMiguel [phone]  Divorced    Smoker since age  72 y.o   Social Determinants of Health   Financial Resource Strain:   . Difficulty of Paying Living Expenses: Not on file  Food Insecurity:   . Worried About Charity fundraiser in the Last Year: Not on file  . Ran Out of Food in the Last Year: Not on file  Transportation Needs:   . Lack of Transportation (Medical): Not on file  . Lack of Transportation (Non-Medical): Not on file  Physical Activity:   . Days of Exercise  per Week: Not on file  . Minutes of Exercise per Session: Not on file  Stress:   . Feeling of Stress : Not on file  Social Connections:   . Frequency of Communication with Friends and Family: Not on file  . Frequency of Social Gatherings with Friends and Family: Not on file  . Attends Religious Services: Not on file  . Active Member of Clubs or Organizations: Not on file  . Attends Archivist Meetings: Not on file  . Marital Status: Not on file  Intimate Partner Violence:   . Fear of Current or Ex-Partner: Not on file  . Emotionally Abused: Not on file  . Physically Abused: Not on file  . Sexually Abused: Not on file     Family History  Problem Relation Age of Onset  . Obesity Mother   . Depression Mother   . Mental illness Mother   . Diabetes Mother   . Hypertension Mother   . Endometriosis Other   . Skin cancer Other   . Miscarriages / Stillbirths Other   . Breast cancer Other        m. great aunt     Current Outpatient Medications:  Marland Kitchen  Multiple Vitamin (MULTIVITAMIN) capsule, Take 1 capsule by mouth daily., Disp: , Rfl:  .  norethindrone (AYGESTIN) 5 MG tablet, TAKE 2 TABLETS BY MOUTH EVERY DAY, Disp: 60 tablet, Rfl: 6 .  Cholecalciferol 1.25 MG (50000 UT) capsule, Take 1 capsule (50,000 Units total) by mouth once a week. (Patient not taking: Reported on 05/17/2019), Disp: 13 capsule, Rfl: 1   Physical exam:  Vitals:   05/17/19 1459  BP: 121/60  Pulse: 73  Temp: 98.3 F (36.8 C)  TempSrc: Tympanic  SpO2: 100%  Weight: 120 lb (54.4 kg)  Height: 5' (1.524 m)   Physical Exam HENT:     Head: Normocephalic and atraumatic.  Eyes:     Pupils: Pupils are equal, round, and reactive to light.  Cardiovascular:     Rate and Rhythm: Normal rate and regular rhythm.     Heart sounds: Normal heart sounds.  Pulmonary:     Effort: Pulmonary effort is normal.     Breath sounds: Normal breath sounds.  Abdominal:     General: Bowel sounds are normal.      Palpations: Abdomen is soft.     Comments: No palpable splenomegaly  Musculoskeletal:     Cervical back: Normal range of motion.  Lymphadenopathy:     Comments: No palpable cervical, supraclavicular, axillary or inguinal adenopathy   Skin:    General: Skin is warm and dry.  Neurological:     Mental Status: She is alert and oriented to person, place, and time.        CMP Latest Ref Rng & Units 05/10/2019  Glucose 65 - 99 mg/dL 117(H)  BUN 6 - 20 mg/dL 5(L)  Creatinine 0.57 - 1.00 mg/dL 0.78  Sodium 134 - 144 mmol/L 136  Potassium 3.5 -  5.2 mmol/L 4.7  Chloride 96 - 106 mmol/L 100  CO2 20 - 29 mmol/L 24  Calcium 8.7 - 10.2 mg/dL 9.4  Total Protein 6.0 - 8.5 g/dL 6.6  Total Bilirubin 0.0 - 1.2 mg/dL <0.2  Alkaline Phos 39 - 117 IU/L 67  AST 0 - 40 IU/L 22  ALT 0 - 32 IU/L 24   CBC Latest Ref Rng & Units 05/10/2019  WBC 3.4 - 10.8 x10E3/uL 6.7  Hemoglobin 11.1 - 15.9 g/dL 12.7  Hematocrit 34.0 - 46.6 % 40.2  Platelets 150 - 450 x10E3/uL 476(H)     Assessment and plan- Patient is a 39 y.o. female referred for thrombocytosis  Discussed with the patient that thrombocytosis can be primary versus secondary.  Would like to rule out secondary thrombocytosis in her case and check for ESR, smear review as well as iron studies.  Even if she is found to have primary myeloproliferative disorder at her age it would not change management.  I am inclined to keep an eye on her platelet count and monitor her trends and if there is a consistent increase in her platelet count.  I will consider doing additional studies at that time including JAK2, CAL R, MPL and BCR ABL testing.  I will discuss the results of the blood work with the patient in 10 days with a video visit   Thank you for this kind referral and the opportunity to participate in the care of this  Patient   Visit Diagnosis 1. Thrombocytosis ([place])     Dr. Randa Evens, MD, MPH [facility] at [facility] [phone] 05/20/2019 8:27 AM

## 2019-05-23 ENCOUNTER — Encounter: Payer: Self-pay | Admitting: Oncology

## 2019-05-27 ENCOUNTER — Encounter: Payer: Self-pay | Admitting: Oncology

## 2019-05-27 ENCOUNTER — Inpatient Hospital Stay: Payer: Managed Care, Other (non HMO) | Attending: Oncology | Admitting: Oncology

## 2019-05-27 DIAGNOSIS — D473 Essential (hemorrhagic) thrombocythemia: Secondary | ICD-10-CM | POA: Diagnosis not present

## 2019-05-27 DIAGNOSIS — E611 Iron deficiency: Secondary | ICD-10-CM

## 2019-05-27 DIAGNOSIS — D75839 Thrombocytosis, unspecified: Secondary | ICD-10-CM

## 2019-05-27 NOTE — Progress Notes (Signed)
Pt has no sx that she knows of with plt count elevated. She just knows that you were doing lab wokr to determine why the plt are up

## 2019-05-30 NOTE — Progress Notes (Signed)
I connected with Anita Hobbs on 05/30/19 at 11:15 AM EST by video enabled telemedicine visit and verified that I am speaking with the correct person using two identifiers.   I discussed the limitations, risks, security and privacy concerns of performing an evaluation and management service by telemedicine and the availability of in-person appointments. I also discussed with the patient that there may be a patient responsible charge related to this service. The patient expressed understanding and agreed to proceed.  Other persons participating in the visit and their role in the encounter:  None  There were problems during the video visit connection and had to be switched to a telephone visit in between  Patient's location:  home Provider's location:  work  Risk analyst Complaint: Discuss results of blood work  Diagnosis: Thrombocytosis likely reactive secondary to iron deficiency  History of present illness: patient is a 39 year old female referred to [place] for thrombocytosis.Patient was noted to have platelet count of 208 in 2017.  Platelet count was 474 in October 2020 and 476 on 05/10/2019.  White count and hemoglobin have been normal.  MCV normal.  She is a chronic smoker.  She denies any prior history of thromboembolic episodes.    Results of blood work from 05/17/2019 were as follows: CBC showed white cell count of 7.5, H&H of 12.5/37.8 and a platelet count of 477.  Iron study showed a low iron saturation of 4% and ferritin of 5.  TIBC was elevated at 509.  Interval history: Patient feels well and denies any complaints at this time   Review of Systems  Constitutional: Negative for chills, fever, malaise/fatigue and weight loss.  HENT: Negative for congestion, ear discharge and nosebleeds.   Eyes: Negative for blurred vision.  Respiratory: Negative for cough, hemoptysis, sputum production, shortness of breath and wheezing.   Cardiovascular: Negative for chest pain, palpitations, orthopnea and  claudication.  Gastrointestinal: Negative for abdominal pain, blood in stool, constipation, diarrhea, heartburn, melena, nausea and vomiting.  Genitourinary: Negative for dysuria, flank pain, frequency, hematuria and urgency.  Musculoskeletal: Negative for back pain, joint pain and myalgias.  Skin: Negative for rash.  Neurological: Negative for dizziness, tingling, focal weakness, seizures, weakness and headaches.  Endo/Heme/Allergies: Does not bruise/bleed easily.  Psychiatric/Behavioral: Negative for depression and suicidal ideas. The patient does not have insomnia.     No Known Allergies  Past Medical History:  Diagnosis Date  . Anxiety   . Depression   . Dysmenorrhea   . Endometriosis   . History of substance abuse ([place])    Percocet  . HLD (hyperlipidemia)   . Infertility, female   . Menorrhagia   . Vitamin D deficiency     Past Surgical History:  Procedure Laterality Date  . CYSTECTOMY     @ age 71yrs, rt ovary     Social History   Socioeconomic History  . Marital status: Married    Spouse name: Not on file  . Number of children: Not on file  . Years of education: Not on file  . Highest education level: Not on file  Occupational History  . Not on file  Tobacco Use  . Smoking status: Current Every Day Smoker    Packs/day: 0.50    Years: 25.00    Pack years: 12.50    Types: Cigarettes  . Smokeless tobacco: Never Used  Substance and Sexual Activity  . Alcohol use: Not Currently  . Drug use: Not Currently    Comment: history of substance abuse (Percocet)  .  Sexual activity: Yes    Birth control/protection: None, Pill  Other Topics Concern  . 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 Difficulty of Paying Living Expenses: Not on file  Food Insecurity:   . Worried About Ship broker in the Last Year: Not on file  . Ran Out of Food in the Last Year: Not on file  Transportation Needs:   . Lack of Transportation (Medical): Not on file  . Lack of Transportation (Non-Medical): Not on file  Physical Activity:   . Days of Exercise per Week: Not on file  . Minutes of Exercise per Session: Not on file  Stress:   . Feeling of Stress : Not on file  Social Connections:   . Frequency of Communication with Friends and Family: Not on file  . Frequency of Social Gatherings with Friends and Family: Not on file  . Attends Religious Services: Not on file  . Active Member of Clubs or Organizations: Not on file  . Attends Archivist Meetings: Not on file  . Marital Status: Not on file  Intimate Partner Violence:   . Fear of Current or Ex-Partner: Not on file  . Emotionally Abused: Not on file  . Physically Abused: Not on file  . Sexually Abused: Not on file    Family History  Problem Relation Age of Onset  . Obesity Mother   . Depression Mother   . Mental illness Mother   . Diabetes Mother   . Hypertension Mother   . Endometriosis Other   . Skin cancer Other   . Miscarriages / Stillbirths Other   . Breast cancer Other        m. great aunt     Current Outpatient Medications:  Marland Kitchen  Multiple Vitamin (MULTIVITAMIN) capsule, Take 1 capsule by mouth daily., Disp: , Rfl:  .  norethindrone (AYGESTIN) 5 MG tablet, TAKE 2 TABLETS BY MOUTH EVERY DAY, Disp: 60 tablet, Rfl: 6  No results found.  No images are attached to the encounter.   CMP Latest Ref Rng & Units 05/10/2019  Glucose 65 - 99 mg/dL 117(H)  BUN 6 - 20 mg/dL 5(L)  Creatinine 0.57 - 1.00 mg/dL 0.78  Sodium 134 - 144 mmol/L 136  Potassium 3.5 - 5.2 mmol/L 4.7  Chloride 96 - 106 mmol/L 100  CO2 20 - 29 mmol/L 24  Calcium 8.7 - 10.2 mg/dL 9.4  Total Protein 6.0 - 8.5 g/dL 6.6  Total Bilirubin 0.0 - 1.2 mg/dL <0.2  Alkaline Phos 39 - 117 IU/L 67  AST 0 - 40 IU/L 22  ALT 0 - 32 IU/L 24   CBC  Latest Ref Rng & Units 05/10/2019  WBC 3.4 - 10.8 x10E3/uL 6.7  Hemoglobin 11.1 - 15.9 g/dL 12.7  Hematocrit 34.0 - 46.6 % 40.2  Platelets 150 - 450 x10E3/uL 476(H)    Assessment and plan: Patient is a 39 year old female referred for thrombocytosis likely reactive secondary to iron deficiency  I discussed the results of blood work with the patient.  Although she is not anemic she does have features of iron deficiency and thrombocytosis may very well be secondary to that.  Discussed that we need to replenish her iron stores first and then  repeat her platelet levels after 3 months.  Discussed oral versus IV iron.  Patient would like to try oral iron.  I have advised that she should take ferrous sulfate 325 mg once or twice a day.  Follow-up instructions: CBC ferritin and iron studies to be checked at [facility] in 3 months followed by a video visit  I discussed the assessment and treatment plan with the patient. The patient was provided an opportunity to ask questions and all were answered. The patient agreed with the plan and demonstrated an understanding of the instructions.   The patient was advised to call back or seek an in-person evaluation if the symptoms worsen or if the condition fails to improve as anticipated.   Visit Diagnosis: 1. Thrombocytosis ([place])   2. Iron deficiency     Dr. Randa Evens, MD, MPH [facility] at [facility] Tel- [phone] 05/30/2019 3:12 PM

## 2019-05-31 ENCOUNTER — Encounter: Payer: Self-pay | Admitting: Oncology

## 2019-08-02 ENCOUNTER — Other Ambulatory Visit: Payer: Self-pay

## 2019-08-02 ENCOUNTER — Encounter: Payer: Self-pay | Admitting: Internal Medicine

## 2019-08-02 ENCOUNTER — Ambulatory Visit: Payer: Managed Care, Other (non HMO) | Admitting: Internal Medicine

## 2019-08-02 VITALS — BP 122/78 | HR 89 | Temp 98.1°F | Ht 60.0 in | Wt 127.0 lb

## 2019-08-02 DIAGNOSIS — D473 Essential (hemorrhagic) thrombocythemia: Secondary | ICD-10-CM

## 2019-08-02 DIAGNOSIS — E785 Hyperlipidemia, unspecified: Secondary | ICD-10-CM

## 2019-08-02 DIAGNOSIS — F419 Anxiety disorder, unspecified: Secondary | ICD-10-CM

## 2019-08-02 DIAGNOSIS — Z0000 Encounter for general adult medical examination without abnormal findings: Secondary | ICD-10-CM

## 2019-08-02 DIAGNOSIS — Z720 Tobacco use: Secondary | ICD-10-CM

## 2019-08-02 DIAGNOSIS — D75839 Thrombocytosis, unspecified: Secondary | ICD-10-CM

## 2019-08-02 DIAGNOSIS — F329 Major depressive disorder, single episode, unspecified: Secondary | ICD-10-CM

## 2019-08-02 DIAGNOSIS — E559 Vitamin D deficiency, unspecified: Secondary | ICD-10-CM

## 2019-08-02 NOTE — Patient Instructions (Signed)
Replika, bloom, calm, headspace, insight timer Aromatherapy  Exercise   High Cholesterol  High cholesterol is a condition in which the blood has high levels of a white, waxy, fat-like substance (cholesterol). The human body needs small amounts of cholesterol. The liver makes all the cholesterol that the body needs. Extra (excess) cholesterol comes from the food that we eat. Cholesterol is carried from the liver by the blood through the blood vessels. If you have high cholesterol, deposits (plaques) may build up on the walls of your blood vessels (arteries). Plaques make the arteries narrower and stiffer. Cholesterol plaques increase your risk for heart attack and stroke. Work with your health care provider to keep your cholesterol levels in a healthy range. What increases the risk? This condition is more likely to develop in people who:  Eat foods that are high in animal fat (saturated fat) or cholesterol.  Are overweight.  Are not getting enough exercise.  Have a family history of high cholesterol. What are the signs or symptoms? There are no symptoms of this condition. How is this diagnosed? This condition may be diagnosed from the results of a blood test.  If you are older than age 54, your health care provider may check your cholesterol every 4-6 years.  You may be checked more often if you already have high cholesterol or other risk factors for heart disease. The blood test for cholesterol measures:  "Bad" cholesterol (LDL cholesterol). This is the main type of cholesterol that causes heart disease. The desired level for LDL is less than 100.  "Good" cholesterol (HDL cholesterol). This type helps to protect against heart disease by cleaning the arteries and carrying the LDL away. The desired level for HDL is 60 or higher.  Triglycerides. These are fats that the body can store or burn for energy. The desired number for triglycerides is lower than 150.  Total cholesterol. This is  a measure of the total amount of cholesterol in your blood, including LDL cholesterol, HDL cholesterol, and triglycerides. A healthy number is less than 200. How is this treated? This condition is treated with diet changes, lifestyle changes, and medicines. Diet changes  This may include eating more whole grains, fruits, vegetables, nuts, and fish.  This may also include cutting back on red meat and foods that have a lot of added sugar. Lifestyle changes  Changes may include getting at least 40 minutes of aerobic exercise 3 times a week. Aerobic exercises include walking, biking, and swimming. Aerobic exercise along with a healthy diet can help you maintain a healthy weight.  Changes may also include quitting smoking. Medicines  Medicines are usually given if diet and lifestyle changes have failed to reduce your cholesterol to healthy levels.  Your health care provider may prescribe a statin medicine. Statin medicines have been shown to reduce cholesterol, which can reduce the risk of heart disease. Follow these instructions at home: Eating and drinking If told by your health care provider:  Eat chicken (without skin), fish, veal, shellfish, ground [place] breast, and round or loin cuts of red meat.  Do not eat fried foods or fatty meats, such as hot dogs and salami.  Eat plenty of fruits, such as apples.  Eat plenty of vegetables, such as broccoli, potatoes, and carrots.  Eat beans, peas, and lentils.  Eat grains such as barley, rice, couscous, and bulgur wheat.  Eat pasta without cream sauces.  Use skim or nonfat milk, and eat low-fat or nonfat yogurt and cheeses.  Do  not eat or drink whole milk, cream, ice cream, egg yolks, or hard cheeses.  Do not eat stick margarine or tub margarines that contain trans fats (also called partially hydrogenated oils).  Do not eat saturated tropical oils, such as coconut oil and palm oil.  Do not eat cakes, cookies, crackers, or other  baked goods that contain trans fats.  General instructions  Exercise as directed by your health care provider. Increase your activity level with activities such as gardening, walking, and taking the stairs.  Take over-the-counter and prescription medicines only as told by your health care provider.  Do not use any products that contain nicotine or tobacco, such as cigarettes and e-cigarettes. If you need help quitting, ask your health care provider.  Keep all follow-up visits as told by your health care provider. This is important. Contact a health care provider if:  You are struggling to maintain a healthy diet or weight.  You need help to start on an exercise program.  You need help to stop smoking. Get help right away if:  You have chest pain.  You have trouble breathing. This information is not intended to replace advice given to you by your health care provider. Make sure you discuss any questions you have with your health care provider. Document Revised: 04/10/2017 Document Reviewed: 10/06/2015 Elsevier Patient Education  [place].  Cholesterol Content in Foods Cholesterol is a waxy, fat-like substance that helps to carry fat in the blood. The body needs cholesterol in small amounts, but too much cholesterol can cause damage to the arteries and heart. Most people should eat less than 200 milligrams (mg) of cholesterol a day. Foods with cholesterol  Cholesterol is found in animal-based foods, such as meat, seafood, and dairy. Generally, low-fat dairy and lean meats have less cholesterol than full-fat dairy and fatty meats. The milligrams of cholesterol per serving (mg per serving) of common cholesterol-containing foods are listed below. Meat and other proteins  Egg -- one large whole egg has 186 mg.  Veal shank -- 4 oz has 141 mg.  Lean ground [place] (93% lean) -- 4 oz has 118 mg.  Fat-trimmed lamb loin -- 4 oz has 106 mg.  Lean ground beef (90% lean) -- 4 oz  has 100 mg.  Lobster -- 3.5 oz has 90 mg.  Pork loin chops -- 4 oz has 86 mg.  Canned salmon -- 3.5 oz has 83 mg.  Fat-trimmed beef top loin -- 4 oz has 78 mg.  Frankfurter -- 1 frank (3.5 oz) has 77 mg.  Crab -- 3.5 oz has 71 mg.  Roasted chicken without skin, white meat -- 4 oz has 66 mg.  Light bologna -- 2 oz has 45 mg.  Deli-cut [place] -- 2 oz has 31 mg.  Canned tuna -- 3.5 oz has 31 mg.  Anita Hobbs -- 1 oz has 29 mg.  Oysters and mussels (raw) -- 3.5 oz has 25 mg.  Mackerel -- 1 oz has 22 mg.  Trout -- 1 oz has 20 mg.  Pork sausage -- 1 link (1 oz) has 17 mg.  Salmon -- 1 oz has 16 mg.  Tilapia -- 1 oz has 14 mg. Dairy  Soft-serve ice cream --  cup (4 oz) has 103 mg.  Whole-milk yogurt -- 1 cup (8 oz) has 29 mg.  Cheddar cheese -- 1 oz has 28 mg.  American cheese -- 1 oz has 28 mg.  Whole milk -- 1 cup (8 oz) has 23  mg.  2% milk -- 1 cup (8 oz) has 18 mg.  Cream cheese -- 1 tablespoon (Tbsp) has 15 mg.  Cottage cheese --  cup (4 oz) has 14 mg.  Low-fat (1%) milk -- 1 cup (8 oz) has 10 mg.  Sour cream -- 1 Tbsp has 8.5 mg.  Low-fat yogurt -- 1 cup (8 oz) has 8 mg.  Nonfat Greek yogurt -- 1 cup (8 oz) has 7 mg.  Half-and-half cream -- 1 Tbsp has 5 mg. Fats and oils  Cod liver oil -- 1 tablespoon (Tbsp) has 82 mg.  Butter -- 1 Tbsp has 15 mg.  Lard -- 1 Tbsp has 14 mg.  Bacon grease -- 1 Tbsp has 14 mg.  Mayonnaise -- 1 Tbsp has 5-10 mg.  Margarine -- 1 Tbsp has 3-10 mg. Exact amounts of cholesterol in these foods may vary depending on specific ingredients and brands. Foods without cholesterol Most plant-based foods do not have cholesterol unless you combine them with a food that has cholesterol. Foods without cholesterol include:  Grains and cereals.  Vegetables.  Fruits.  Vegetable oils, such as olive, canola, and sunflower oil.  Legumes, such as peas, beans, and lentils.  Nuts and seeds.  Egg whites. Summary  The body  needs cholesterol in small amounts, but too much cholesterol can cause damage to the arteries and heart.  Most people should eat less than 200 milligrams (mg) of cholesterol a day. This information is not intended to replace advice given to you by your health care provider. Make sure you discuss any questions you have with your health care provider. Document Revised: 03/20/2017 Document Reviewed: 12/02/2016 Elsevier Patient Education  [place].

## 2019-08-02 NOTE — Progress Notes (Signed)
Chief Complaint  Patient presents with  . Follow-up   Annual 1. Thrombocytosis needs to have repeat CBC and iron and ferritin in 2 weeks [facility] and f/u Dr. Janese Banks 08/2019 she is still smoking 15 cig qd  2. HLD no indication for meds yet  3 vit D def 4. Anxiety/depression GAD 7 score 7 and PHQ9 score 8 declines meds therapy 2/2 to cost and does have a life coach which she sees at times and he helps    Review of Systems  Constitutional: Negative for weight loss.  HENT: Negative for hearing loss.   Eyes: Negative for blurred vision.  Respiratory: Negative for shortness of breath.   Cardiovascular: Negative for chest pain.  Gastrointestinal: Negative for abdominal pain.  Musculoskeletal: Negative for falls.  Skin: Negative for rash.  Neurological: Negative for headaches.  Psychiatric/Behavioral: Positive for depression. The patient is nervous/anxious.    Past Medical History:  Diagnosis Date  . Anxiety   . Depression   . Dysmenorrhea   . Endometriosis   . 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 HLD (hyperlipidemia)   . Infertility, female   . Menorrhagia   . Vitamin D deficiency    Past Surgical History:  Procedure Laterality Date  . CYSTECTOMY     @ age 7yrs, rt ovary    Family History  Problem Relation Age of Onset  . Obesity Mother        ? cause of death sepsis   . Depression Mother   . Mental illness Mother   . Diabetes Mother   . Hypertension Mother   . Skin cancer Other   . Breast cancer Maternal Aunt   . Miscarriages / Stillbirths Maternal Grandmother   . Endometriosis Maternal Aunt    Social History   Socioeconomic History  . Marital status: Married    Spouse name: Not on file  . Number of children: Not on file  . Years of education: Not on file  . Highest education level: Not on file  Occupational History  . Not on file  Tobacco Use  . Smoking status: Current Every Day Smoker    Packs/day: 0.50    Years: 25.00    Pack years: 12.50     Types: Cigarettes  . Smokeless tobacco: Never Used  Substance and Sexual Activity  . Alcohol use: Not Currently  . Drug use: Not Currently    Comment: history of substance abuse (Percocet)  . Sexual activity: Yes    Birth control/protection: None, Pill  Other Topics Concern  . 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 Difficulty of Paying Living Expenses:   Food Insecurity:   . Worried About Charity fundraiser in the Last Year:   . Arboriculturist in the Last Year:   Transportation Needs:   . Film/video editor (Medical):   Marland Kitchen Lack of Transportation (Non-Medical):   Physical Activity:   . Days of Exercise per Week:   . Minutes of Exercise per Session:   Stress:   . Feeling of Stress :   Social Connections:   . Frequency of Communication with Friends and Family:   . Frequency of Social Gatherings with Friends and Family:   .  Attends Religious Services:   . Active Member of Clubs or Organizations:   . Attends Archivist Meetings:   Marland Kitchen Marital Status:   Intimate Partner Violence:   . Fear of Current or Ex-Partner:   . Emotionally Abused:   Marland Kitchen Physically Abused:   . Sexually Abused:    Current Meds  Medication Sig  . Ferrous Sulfate (IRON PO) Take by mouth daily.  . Multiple Vitamin (MULTIVITAMIN) capsule Take 1 capsule by mouth daily.  . norethindrone (AYGESTIN) 5 MG tablet TAKE 2 TABLETS BY MOUTH EVERY DAY  . Vitamin D, Ergocalciferol, (DRISDOL) 1.25 MG (50000 UNIT) CAPS capsule Take 50,000 Units by mouth every 7 (seven) days.   No Known Allergies Recent Results (from the past 2160 hour(s))  Lipid panel     Status: Abnormal   Collection Time: 05/10/19  2:28 PM  Result Value Ref Range   Cholesterol, Total 189 100 - 199 mg/dL   Triglycerides 130 0 - 149 mg/dL   HDL 19 (L) >39 mg/dL    VLDL Cholesterol Cal 24 5 - 40 mg/dL   LDL Chol Calc (NIH) 146 (H) 0 - 99 mg/dL   Chol/HDL Ratio 9.9 (H) 0.0 - 4.4 ratio    Comment:                                   T. Chol/HDL Ratio                                             Men  Women                               1/2 Avg.Risk  3.4    3.3                                   Avg.Risk  5.0    4.4                                2X Avg.Risk  9.6    7.1                                3X Avg.Risk 23.4   11.0   Comprehensive metabolic panel     Status: Abnormal   Collection Time: 05/10/19  2:28 PM  Result Value Ref Range   Glucose 117 (H) 65 - 99 mg/dL   BUN 5 (L) 6 - 20 mg/dL   Creatinine, Ser 0.78 0.57 - 1.00 mg/dL   GFR calc non Af Amer 97 >59 mL/min/1.73   GFR calc Af Amer 112 >59 mL/min/1.73   BUN/Creatinine Ratio 6 (L) 9 - 23   Sodium 136 134 - 144 mmol/L   Potassium 4.7 3.5 - 5.2 mmol/L   Chloride 100 96 - 106 mmol/L   CO2 24 20 - 29 mmol/L   Calcium 9.4 8.7 - 10.2 mg/dL   Total Protein 6.6 6.0 - 8.5 g/dL   Albumin 4.1 3.8 - 4.8 g/dL   Globulin, Total 2.5 1.5 - 4.5 g/dL   Albumin/Globulin  Ratio 1.6 1.2 - 2.2   Bilirubin Total <0.2 0.0 - 1.2 mg/dL   Alkaline Phosphatase 67 39 - 117 IU/L   AST 22 0 - 40 IU/L   ALT 24 0 - 32 IU/L  CBC with Differential/Platelet     Status: Abnormal   Collection Time: 05/10/19  2:28 PM  Result Value Ref Range   WBC 6.7 3.4 - 10.8 x10E3/uL   RBC 4.80 3.77 - 5.28 x10E6/uL   Hemoglobin 12.7 11.1 - 15.9 g/dL   Hematocrit 40.2 34.0 - 46.6 %   MCV 84 79 - 97 fL   MCH 26.5 (L) 26.6 - 33.0 pg   MCHC 31.6 31.5 - 35.7 g/dL   RDW 15.0 11.7 - 15.4 %   Platelets 476 (H) 150 - 450 x10E3/uL   Neutrophils 50 Not Estab. %   Lymphs 39 Not Estab. %   Monocytes 8 Not Estab. %   Eos 2 Not Estab. %   Basos 1 Not Estab. %   Neutrophils Absolute 3.4 1.4 - 7.0 x10E3/uL   Lymphocytes Absolute 2.6 0.7 - 3.1 x10E3/uL   Monocytes Absolute 0.5 0.1 - 0.9 x10E3/uL   EOS (ABSOLUTE) 0.1 0.0 - 0.4 x10E3/uL    Basophils Absolute 0.1 0.0 - 0.2 x10E3/uL   Immature Granulocytes 0 Not Estab. %   Immature Grans (Abs) 0.0 0.0 - 0.1 x10E3/uL  Urinalysis, Routine w reflex microscopic     Status: None   Collection Time: 05/10/19  2:28 PM  Result Value Ref Range   Specific Gravity, UA 1.016 1.005 - 1.030   pH, UA 6.5 5.0 - 7.5   Color, UA Yellow Yellow   Appearance Ur Clear Clear   Leukocytes,UA Negative Negative   Protein,UA Negative Negative/Trace   Glucose, UA Negative Negative   Ketones, UA Negative Negative   RBC, UA Negative Negative   Bilirubin, UA Negative Negative   Urobilinogen, Ur 0.2 0.2 - 1.0 mg/dL   Nitrite, UA Negative Negative   Microscopic Examination Comment     Comment: Microscopic not indicated and not performed.  Vitamin D (25 hydroxy)     Status: Abnormal   Collection Time: 05/10/19  2:28 PM  Result Value Ref Range   Vit D, 25-Hydroxy 16.5 (L) 30.0 - 100.0 ng/mL    Comment: Vitamin D deficiency has been defined by the [place] practice guideline as a level of serum 25-OH vitamin D less than 20 ng/mL (1,2). The Endocrine Society went on to further define vitamin D insufficiency as a level between 21 and 29 ng/mL (2). 1. IOM (Institute of Medicine). 2010. Dietary reference    intakes for calcium and D. [place]: The    [employer]. 2. Holick MF, Binkley [place], Bischoff-Ferrari HA, et al.    Evaluation, treatment, and prevention of vitamin D    deficiency: an Endocrine Society clinical practice    guideline. JCEM. 2011 Jul; 96(7):1911-30.   Hemoglobin A1c     Status: None   Collection Time: 05/10/19  2:28 PM  Result Value Ref Range   Hgb A1c MFr Bld 4.8 4.8 - 5.6 %    Comment:          Prediabetes: 5.7 - 6.4          Diabetes: >6.4          Glycemic control for adults with diabetes: <7.0    Est. average glucose Bld gHb Est-mCnc 91 mg/dL  Specimen status report  Status: None   Collection Time: 05/10/19  2:28 PM   Result Value Ref Range   specimen status report Comment     Comment: Written Authorization Written Authorization Written Authorization Received. Authorization received from [employer] 05-12-2019 Logged by Ivar Bury    Objective  Body mass index is 24.8 kg/m. Wt Readings from Last 3 Encounters:  08/02/19 127 lb (57.6 kg)  05/17/19 120 lb (54.4 kg)  05/03/19 118 lb (53.5 kg)   Temp Readings from Last 3 Encounters:  08/02/19 98.1 F (36.7 C) (Temporal)  05/17/19 98.3 F (36.8 C) (Tympanic)   BP Readings from Last 3 Encounters:  08/02/19 122/78  05/17/19 121/60  01/21/19 119/76   Pulse Readings from Last 3 Encounters:  08/02/19 89  05/17/19 73  01/21/19 80    Physical Exam Vitals and nursing note reviewed.  Constitutional:      Appearance: Normal appearance. She is well-developed and well-groomed.  HENT:     Head: Normocephalic and atraumatic.  Eyes:     Conjunctiva/sclera: Conjunctivae normal.     Pupils: Pupils are equal, round, and reactive to light.  Cardiovascular:     Rate and Rhythm: Normal rate and regular rhythm.     Heart sounds: Normal heart sounds. No murmur.  Pulmonary:     Effort: Pulmonary effort is normal.     Breath sounds: Normal breath sounds.  Skin:    General: Skin is warm and dry.  Neurological:     General: No focal deficit present.     Mental Status: She is alert and oriented to person, place, and time. Mental status is at baseline.     Gait: Gait normal.  Psychiatric:        Attention and Perception: Attention and perception normal.        Mood and Affect: Mood and affect normal.        Speech: Speech normal.        Behavior: Behavior normal. Behavior is cooperative.        Thought Content: Thought content normal.        Cognition and Memory: Cognition and memory normal.        Judgment: Judgment normal.     Assessment  Plan  Annual physical exam Declines flu shot and covid 19 vx for not  Consider Tdap per pt may  have had in 2015  rec smoking cessation smoker since age 15 y.o 1/2 to 1ppd  Pap 01/25/2018 +HPV will CC Dr. Marcelline Mates when due to f/u I thought this is yearly 01/2019 and 01/2020 and will get repeat pap in 01/2021 per ob/gyn notes   mammo age 63  Colonoscopy age 59  Skin mole on back trunk Consider dermatology in the future  Declines HIV and hep C check rec smoking cessation and healthy diet and exercise   Thrombocytosis ([place]) - Plan: CBC with Differential/Platelet, Iron, TIBC and Ferritin Panel  Vitamin D deficiency - Plan: Vitamin D (25 hydroxy) D3 50K IU weekly x 6 months  Anxiety and depression Disc meditation apps and consider life coach f/u and therapy in future  Hyperlipidemia, unspecified hyperlipidemia type - Plan: Lipid panel      Provider: Dr. Olivia Mackie [facility]

## 2019-08-16 ENCOUNTER — Telehealth: Payer: Self-pay | Admitting: *Deleted

## 2019-08-16 LAB — IRON,TIBC AND FERRITIN PANEL
Ferritin: 57 ng/mL (ref 15–150)
Iron Saturation: 40 % (ref 15–55)
Iron: 147 ug/dL (ref 27–159)
Total Iron Binding Capacity: 363 ug/dL (ref 250–450)
UIBC: 216 ug/dL (ref 131–425)

## 2019-08-16 LAB — VITAMIN D 25 HYDROXY (VIT D DEFICIENCY, FRACTURES): Vit D, 25-Hydroxy: 79.2 ng/mL (ref 30.0–100.0)

## 2019-08-16 LAB — LIPID PANEL
Chol/HDL Ratio: 10.2 ratio — ABNORMAL HIGH (ref 0.0–4.4)
Cholesterol, Total: 204 mg/dL — ABNORMAL HIGH (ref 100–199)
HDL: 20 mg/dL — ABNORMAL LOW (ref >39)
LDL Chol Calc (NIH): 168 mg/dL — ABNORMAL HIGH (ref 0–99)
Triglycerides: 88 mg/dL (ref 0–149)
VLDL Cholesterol Cal: 16 mg/dL (ref 5–40)

## 2019-08-16 LAB — CBC WITH DIFFERENTIAL/PLATELET
Basophils Absolute: 0.1 10*3/uL (ref 0.0–0.2)
Basos: 1 %
EOS (ABSOLUTE): 0.2 10*3/uL (ref 0.0–0.4)
Eos: 2 %
Hematocrit: 46 % (ref 34.0–46.6)
Hemoglobin: 15.8 g/dL (ref 11.1–15.9)
Immature Grans (Abs): 0 10*3/uL (ref 0.0–0.1)
Immature Granulocytes: 0 %
Lymphocytes Absolute: 2.4 10*3/uL (ref 0.7–3.1)
Lymphs: 31 %
MCH: 31.4 pg (ref 26.6–33.0)
MCHC: 34.3 g/dL (ref 31.5–35.7)
MCV: 92 fL (ref 79–97)
Monocytes Absolute: 0.4 10*3/uL (ref 0.1–0.9)
Monocytes: 5 %
Neutrophils Absolute: 4.9 10*3/uL (ref 1.4–7.0)
Neutrophils: 61 %
Platelets: 316 10*3/uL (ref 150–450)
RBC: 5.03 x10E6/uL (ref 3.77–5.28)
RDW: 16.9 % — ABNORMAL HIGH (ref 11.7–15.4)
WBC: 7.9 10*3/uL (ref 3.4–10.8)

## 2019-08-16 NOTE — Telephone Encounter (Signed)
Left message for patient to return cal  for labs .

## 2019-08-16 NOTE — Telephone Encounter (Signed)
-----   Message from Delorise Jackson, MD sent at 08/16/2019  9:08 AM EDT ----- Platelets now normal  Iron normal Vitamin D normal continue supplemental vitamin D3 2000 to 4000 IU daily  Cholesterol elevated  -do you want to see a cardiologist who specializes in cholesterol?  Or try healthy diet and exercise?

## 2019-08-23 ENCOUNTER — Encounter: Payer: Self-pay | Admitting: Oncology

## 2019-08-23 ENCOUNTER — Inpatient Hospital Stay: Payer: Managed Care, Other (non HMO) | Attending: Oncology | Admitting: Oncology

## 2019-08-23 DIAGNOSIS — D473 Essential (hemorrhagic) thrombocythemia: Secondary | ICD-10-CM

## 2019-08-23 DIAGNOSIS — E611 Iron deficiency: Secondary | ICD-10-CM | POA: Diagnosis not present

## 2019-08-23 DIAGNOSIS — D75839 Thrombocytosis, unspecified: Secondary | ICD-10-CM

## 2019-08-23 NOTE — Progress Notes (Signed)
Patient scheduled for follow up regarding thrombocytosis. Patient denies concerns today.

## 2019-08-24 ENCOUNTER — Encounter: Payer: Self-pay | Admitting: Internal Medicine

## 2019-08-26 NOTE — Progress Notes (Signed)
I connected with Anita Hobbs on 08/26/19 at 11:30 AM EDT by video enabled telemedicine visit and verified that I am speaking with the correct person using two identifiers.   I discussed the limitations, risks, security and privacy concerns of performing an evaluation and management service by telemedicine and the availability of in-person appointments. I also discussed with the patient that there may be a patient responsible charge related to this service. The patient expressed understanding and agreed to proceed.  Other persons participating in the visit and their role in the encounter:  none  Patient's location:  home Provider's location:  work  Risk analyst Complaint: Thrombocytosis likely secondary to iron deficiency  History of present illness: patient is a 39 year old female referred to [place] for thrombocytosis.Patient was noted to have platelet count of 208 in 2017. Platelet count was 474 in October 2020 and 476 on 05/10/2019. White count and hemoglobin have been normal. MCV normal. She is a chronic smoker. She denies any prior history of thromboembolic episodes.   Results of blood work from 05/17/2019 were as follows: CBC showed white cell count of 7.5, H&H of 12.5/37.8 and a platelet count of 477.  Iron study showed a low iron saturation of 4% and ferritin of 5.  TIBC was elevated at 509.   Interval history : Patient currently feels well and denies any complaints at this time.  She has been taking oral iron which she tolerates it well without any significant side effects.   Review of Systems  Constitutional: Negative for chills, fever, malaise/fatigue and weight loss.  HENT: Negative for congestion, ear discharge and nosebleeds.   Eyes: Negative for blurred vision.  Respiratory: Negative for cough, hemoptysis, sputum production, shortness of breath and wheezing.   Cardiovascular: Negative for chest pain, palpitations, orthopnea and claudication.  Gastrointestinal: Negative for  abdominal pain, blood in stool, constipation, diarrhea, heartburn, melena, nausea and vomiting.  Genitourinary: Negative for dysuria, flank pain, frequency, hematuria and urgency.  Musculoskeletal: Negative for back pain, joint pain and myalgias.  Skin: Negative for rash.  Neurological: Negative for dizziness, tingling, focal weakness, seizures, weakness and headaches.  Endo/Heme/Allergies: Does not bruise/bleed easily.  Psychiatric/Behavioral: Negative for depression and suicidal ideas. The patient does not have insomnia.     No Known Allergies  Past Medical History:  Diagnosis Date  . Anxiety   . Depression   . Dysmenorrhea   . Endometriosis   . 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 HLD (hyperlipidemia)   . Infertility, female   . Menorrhagia   . Vitamin D deficiency     Past Surgical History:  Procedure Laterality Date  . CYSTECTOMY     @ age 37yrs, rt ovary     Social History   Socioeconomic History  . Marital status: Married    Spouse name: Not on file  . Number of children: Not on file  . Years of education: Not on file  . Highest education level: Not on file  Occupational History  . Not on file  Tobacco Use  . Smoking status: Current Every Day Smoker    Packs/day: 0.50    Years: 25.00    Pack years: 12.50    Types: Cigarettes  . Smokeless tobacco: Never Used  Substance and Sexual Activity  . Alcohol use: Not Currently  . Drug use: Not Currently    Comment: history of substance abuse (Percocet)  . Sexual activity: Yes    Birth control/protection: None, Pill  Other Topics Concern  .  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 Difficulty of Paying Living Expenses:   Food Insecurity:   . Worried About Charity fundraiser in the Last Year:   . Arboriculturist in the Last  Year:   Transportation Needs:   . Film/video editor (Medical):   Marland Kitchen Lack of Transportation (Non-Medical):   Physical Activity:   . Days of Exercise per Week:   . Minutes of Exercise per Session:   Stress:   . Feeling of Stress :   Social Connections:   . Frequency of Communication with Friends and Family:   . Frequency of Social Gatherings with Friends and Family:   . Attends Religious Services:   . Active Member of Clubs or Organizations:   . Attends Archivist Meetings:   Marland Kitchen Marital Status:   Intimate Partner Violence:   . Fear of Current or Ex-Partner:   . Emotionally Abused:   Marland Kitchen Physically Abused:   . Sexually Abused:     Family History  Problem Relation Age of Onset  . Obesity Mother        ? cause of death sepsis   . Depression Mother   . Mental illness Mother   . Diabetes Mother   . Hypertension Mother   . Skin cancer Other   . Breast cancer Maternal Aunt   . Miscarriages / Stillbirths Maternal Grandmother   . Endometriosis Maternal Aunt      Current Outpatient Medications:  .  Ferrous Sulfate (IRON PO), Take by mouth daily., Disp: , Rfl:  .  Multiple Vitamin (MULTIVITAMIN) capsule, Take 1 capsule by mouth daily., Disp: , Rfl:  .  norethindrone (AYGESTIN) 5 MG tablet, TAKE 2 TABLETS BY MOUTH EVERY DAY, Disp: 60 tablet, Rfl: 6 .  Vitamin D, Ergocalciferol, (DRISDOL) 1.25 MG (50000 UNIT) CAPS capsule, Take 50,000 Units by mouth every 7 (seven) days., Disp: , Rfl:   No results found.  No images are attached to the encounter.   CMP Latest Ref Rng & Units 05/10/2019  Glucose 65 - 99 mg/dL 117(H)  BUN 6 - 20 mg/dL 5(L)  Creatinine 0.57 - 1.00 mg/dL 0.78  Sodium 134 - 144 mmol/L 136  Potassium 3.5 - 5.2 mmol/L 4.7  Chloride 96 - 106 mmol/L 100  CO2 20 - 29 mmol/L 24  Calcium 8.7 - 10.2 mg/dL 9.4  Total Protein 6.0 - 8.5 g/dL 6.6  Total Bilirubin 0.0 - 1.2 mg/dL <0.2  Alkaline Phos 39 - 117 IU/L 67  AST 0 - 40 IU/L 22  ALT 0 - 32 IU/L 24    CBC Latest Ref Rng & Units 08/15/2019  WBC 3.4 - 10.8 x10E3/uL 7.9  Hemoglobin 11.1 - 15.9 g/dL 15.8  Hematocrit 34.0 - 46.6 % 46.0  Platelets 150 - 450 x10E3/uL 316     Observation/objective: Appears in no acute distress of a video visit today.  Breathing is nonlabored  Assessment and plan: Patient is a 39 year old female who was referred to [place] for thrombocytosis likely reactive secondary to iron deficiency  Patient's thrombocytosis has resolved at this time and a platelet count is normal at 316.  Recent ferritin was also normal at 57 and iron studies are normal.  I will  repeat CBC with differential in 6 months in 1 year and see her back in 1 year.    Iron deficiency: Patient was previously donating blood every 6 weeks which may be the reason of relative iron deficiency.  I have asked her to continue taking oral iron tablets and she can go back to donating blood but do so with a lesser frequency may be every 12 weeks or so to prevent her from getting iron deficient.  Patient verbalized understanding  Follow-up instructions: As above  I discussed the assessment and treatment plan with the patient. The patient was provided an opportunity to ask questions and all were answered. The patient agreed with the plan and demonstrated an understanding of the instructions.   The patient was advised to call back or seek an in-person evaluation if the symptoms worsen or if the condition fails to improve as anticipated.    Visit Diagnosis: 1. Thrombocytosis ([place])   2. Iron deficiency     Dr. Randa Evens, MD, MPH [facility] Tel- [phone] 08/26/2019 8:13 AM

## 2019-09-13 NOTE — Telephone Encounter (Signed)
Pt called in and stated that she needs a 90 day supply on her meds for her insurance to cover it. The pt needs a refill on norethindrone send to [facility] in [place]. Please advise

## 2019-09-14 ENCOUNTER — Other Ambulatory Visit: Payer: Self-pay

## 2019-09-14 MED ORDER — NORETHINDRONE ACETATE 5 MG PO TABS: 10.0000 mg | ORAL_TABLET | Freq: Every day | ORAL | 3 refills | Status: DC

## 2019-09-27 ENCOUNTER — Encounter: Payer: Self-pay | Admitting: Internal Medicine

## 2020-01-24 ENCOUNTER — Telehealth: Payer: Self-pay

## 2020-01-24 NOTE — Telephone Encounter (Signed)
Pt states she has been having night sweats and cloud chills. X 2 months  Is this a side effect of her ocp or perimenopause.   Pls advise.

## 2020-01-26 ENCOUNTER — Telehealth: Payer: Self-pay

## 2020-01-26 NOTE — Telephone Encounter (Signed)
Called patient to get her scheduled for an appointment patient stated she was without insurance as she has started a new job and was wanting to be treated without having to come into the office. That she just wanted to discuss this issue with Dr. Marcelline Mates. Could you please advise.

## 2020-01-26 NOTE — Telephone Encounter (Signed)
Please contact pt and schedule her for an appt with Dr. Marcelline Mates. Thanks [employer]

## 2020-01-26 NOTE — Telephone Encounter (Signed)
Good morning, Please advise. Thanks [employer]

## 2020-02-08 NOTE — Telephone Encounter (Signed)
error 

## 2020-02-17 ENCOUNTER — Encounter: Payer: Managed Care, Other (non HMO) | Admitting: Obstetrics and Gynecology

## 2020-02-23 ENCOUNTER — Other Ambulatory Visit: Payer: Managed Care, Other (non HMO)

## 2020-06-08 ENCOUNTER — Telehealth: Payer: Self-pay

## 2020-06-08 ENCOUNTER — Other Ambulatory Visit: Payer: Self-pay

## 2020-06-08 MED ORDER — NORETHINDRONE ACETATE 5 MG PO TABS: 10.0000 mg | ORAL_TABLET | Freq: Every day | ORAL | 3 refills | Status: DC

## 2020-06-08 NOTE — Telephone Encounter (Signed)
Pt called no answer LM via VM that her last visit with our office was in 01/2019 and she needed to schedule an appointment with Dr. Marcelline Mates before any refills be given.

## 2020-06-08 NOTE — Telephone Encounter (Signed)
Called pt and sent pt a mychart message to schedule her appointment  for a visit for medication refill. Currently no answer from pt.  Anita Hobbs

## 2020-06-08 NOTE — Telephone Encounter (Signed)
Pt called in and stated that she would like to switch her pharmacy to mail order the pt uses [facility]. The pt s requesting a refill on [facility]. Please advise

## 2020-07-26 ENCOUNTER — Encounter: Payer: Self-pay | Admitting: Internal Medicine

## 2020-08-02 ENCOUNTER — Encounter: Payer: Managed Care, Other (non HMO) | Admitting: Internal Medicine

## 2020-08-09 ENCOUNTER — Encounter: Payer: Self-pay | Admitting: Internal Medicine

## 2020-08-21 ENCOUNTER — Telehealth: Payer: Self-pay | Admitting: Oncology

## 2020-08-21 NOTE — Telephone Encounter (Signed)
Spoke with patient to reschedule appt on Thursday 5/5. Patient stated that she would like to cancel this appointment because there is another MD "checking her labs" and monitoring her.

## 2020-08-23 ENCOUNTER — Inpatient Hospital Stay: Payer: 59

## 2020-08-23 ENCOUNTER — Inpatient Hospital Stay: Payer: 59 | Admitting: Oncology

## 2020-09-28 ENCOUNTER — Telehealth: Payer: Self-pay | Admitting: Obstetrics and Gynecology

## 2020-09-28 NOTE — Telephone Encounter (Signed)
Patient called asking to speak to Anita Hobbs about starting anti depressants, I offered to make an appointment she asked for a call back . Please Advise.

## 2020-09-28 NOTE — Telephone Encounter (Signed)
Spoke to pt concerning her call to the office. Pt stated that she having a lot of mood changes and depression. Pt would like to discuss starting medication to help with her mood changes and depression.

## 2020-10-09 ENCOUNTER — Ambulatory Visit: Payer: 59 | Admitting: Obstetrics and Gynecology

## 2020-10-09 ENCOUNTER — Encounter: Payer: Self-pay | Admitting: Obstetrics and Gynecology

## 2020-10-09 ENCOUNTER — Other Ambulatory Visit: Payer: Self-pay

## 2020-10-09 VITALS — BP 116/71 | HR 82 | Ht 60.0 in | Wt 120.3 lb

## 2020-10-09 DIAGNOSIS — Z8659 Personal history of other mental and behavioral disorders: Secondary | ICD-10-CM | POA: Diagnosis not present

## 2020-10-09 DIAGNOSIS — Z639 Problem related to primary support group, unspecified: Secondary | ICD-10-CM

## 2020-10-09 DIAGNOSIS — R454 Irritability and anger: Secondary | ICD-10-CM | POA: Diagnosis not present

## 2020-10-09 MED ORDER — VENLAFAXINE HCL ER 75 MG PO CP24
75.0000 mg | ORAL_CAPSULE | Freq: Every day | ORAL | 3 refills | Status: DC
Start: 2020-10-09 — End: 2021-08-09

## 2020-10-09 NOTE — Patient Instructions (Signed)
Venlafaxine Tablets What is this medication? VENLAFAXINE (VEN la fax een) treats depression and anxiety. It increases the amount of serotonin and norepinephrine in the brain, hormones that helpregulate mood. It belongs to a group of medications called SNRIs. This medicine may be used for other purposes; ask your health care provider orpharmacist if you have questions. COMMON BRAND NAME(S): Effexor What should I tell my care team before I take this medication? They need to know if you have any of these conditions: Bleeding disorders Glaucoma Heart disease High blood pressure High cholesterol Kidney disease Liver disease Low levels of sodium in the blood Mania or bipolar disorder Seizures Suicidal thoughts, plans, or attempt; a previous suicide attempt by you or a family member Take medications that treat or prevent blood clots Thyroid disease An unusual or allergic reaction to venlafaxine, desvenlafaxine, other medications, foods, dyes, or preservatives Pregnant or trying to get pregnant Breast-feeding How should I use this medication? Take this medication by mouth with a glass of water. Follow the directions on the prescription label. Take it with food. Take your medication at regular intervals. Do not take your medication more often than directed. Do not stop taking this medication suddenly except upon the advice of your care team. Stopping this medication too quickly may cause serious side effects or yourcondition may worsen. A special MedGuide will be given to you by the pharmacist with eachprescription and refill. Be sure to read this information carefully each time. Talk to your care team regarding the use of this medication in children.Special care may be needed. Overdosage: If you think you have taken too much of this medicine contact apoison control center or emergency room at once. NOTE: This medicine is only for you. Do not share this medicine with others. What if I miss a  dose? If you miss a dose, take it as soon as you can. If it is almost time for yournext dose, take only that dose. Do not take double or extra doses. What may interact with this medication? Do not take this medication with any of the following: Certain medications for fungal infections like fluconazole, itraconazole, ketoconazole, posaconazole, voriconazole Cisapride Desvenlafaxine Dronedarone Duloxetine Levomilnacipran Linezolid MAOIs like Carbex, Eldepryl, Marplan, Nardil, and Parnate Methylene blue (injected into a vein) Milnacipran Pimozide Thioridazine This medication may also interact with the following: Amphetamines Aspirin and aspirin-like medications Certain medications for depression, anxiety, or psychotic disturbances Certain medications for migraine headaches like almotriptan, eletriptan, frovatriptan, naratriptan, rizatriptan, sumatriptan, zolmitriptan Certain medications for sleep Certain medications that treat or prevent blood clots like dalteparin, enoxaparin, warfarin Cimetidine Clozapine Diuretics Fentanyl Furazolidone Indinavir Isoniazid Lithium Metoprolol NSAIDS, medications for pain and inflammation, like ibuprofen or naproxen Other medications that prolong the QT interval (cause an abnormal heart rhythm) like dofetilide, ziprasidone Procarbazine Rasagiline Supplements like St. John's wort, kava kava, valerian Tramadol Tryptophan This list may not describe all possible interactions. Give your health care provider a list of all the medicines, herbs, non-prescription drugs, or dietary supplements you use. Also tell them if you smoke, drink alcohol, or use illegaldrugs. Some items may interact with your medicine. What should I watch for while using this medication? Tell your care team if your symptoms do not get better or if they get worse. Visit your care team for regular checks on your progress. Because it may take several weeks to see the full effects of  this medication, it is important tocontinue your treatment as prescribed by your care team. Watch for new or worsening thoughts  of suicide or depression. This includes sudden changes in mood, behaviors, or thoughts. These changes can happen at any time but are more common in the beginning of treatment or after a change in dose. Call your care team right away if you experience these thoughts orworsening depression. Manic episodes may happen in patients with bipolar disorder who take this medication. Watch for changes in feelings or behaviors such as feeling anxious, nervous, agitated, panicky, irritable, hostile, aggressive, impulsive, severely restless, overly excited and hyperactive, or trouble sleeping. These changes can happen at any time but are more common in the beginning of treatment or after a change in dose. Call your care team right away if you notice any ofthese symptoms. This medication can cause an increase in blood pressure. Check with your care team for instructions on monitoring your blood pressure while taking thismedication. You may get drowsy or dizzy. Do not drive, use machinery, or do anything that needs mental alertness until you know how this medication affects you. Do not stand or sit up quickly, especially if you are an older patient. This reduces the risk of dizzy or fainting spells. Alcohol may interfere with the effect ofthis medication. Avoid alcoholic drinks. Your mouth may get dry. Chewing sugarless gum, sucking hard candy and drinking plenty of water will help. Contact your care team, if the problem does not goaway or is severe. What side effects may I notice from receiving this medication? Side effects that you should report to your care team as soon as possible: Allergic reactions-skin rash, itching, hives, swelling of the face, lips, tongue, or throat Bleeding-bloody or black, tar-like stools, red or dark brown urine, vomiting blood or brown material that looks like coffee  grounds, small, red or purple spots on skin, unusual bleeding or bruising Heart rhythm changes-fast or irregular heartbeat, dizziness, feeling faint or lightheaded, chest pain, trouble breathing Increase in blood pressure Loss of appetite with weight loss Low sodium level-muscle weakness, fatigue, dizziness, headache, confusion Serotonin syndrome-irritability, confusion, fast or irregular heartbeat, muscle stiffness, twitching muscles, sweating, high fever, seizures, chills, vomiting, diarrhea Sudden eye pain or change in vision such as blurry vision, seeing halos around lights, vision loss Thoughts of suicide or self-harm, worsening mood, feelings of depression Side effects that usually do not require medical attention (report to your careteam if they continue or are bothersome): Anxiety, nervousness Change in sex drive or performance Dizziness Dry mouth Excessive sweating Nausea Tremors or shaking Trouble sleeping This list may not describe all possible side effects. Call your doctor for medical advice about side effects. You may report side effects to FDA at1-800-FDA-1088. Where should I keep my medication? Keep out of the reach of children and pets. Store at a controlled temperature between 20 and 25 degrees C (68 and 77 degrees F), in a dry place. Throw away any unused medication after theexpiration date. NOTE: This sheet is a summary. It may not cover all possible information. If you have questions about this medicine, talk to your doctor, pharmacist, orhealth care provider.  2022 Elsevier/Gold Standard (2020-04-19 17:16:17)
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
## 2020-10-09 NOTE — Progress Notes (Signed)
Pt present for depression and anxiety. Pt stated that she was not sure if it was the medication Aygestin causing the symptoms or hormones. PHQ-9= 11. GAD-7=7

## 2020-11-06 ENCOUNTER — Encounter: Payer: 59 | Admitting: Obstetrics and Gynecology

## 2021-01-23 ENCOUNTER — Encounter: Payer: 59 | Admitting: Obstetrics and Gynecology

## 2021-04-10 ENCOUNTER — Telehealth: Payer: Self-pay | Admitting: Obstetrics and Gynecology

## 2021-04-10 NOTE — Telephone Encounter (Signed)
Pt called states that she needs a refill on RX: norethindrone through [facility] - her physical got rescheduled from 12-23 -- to 1-24. Please Advise.

## 2021-04-11 ENCOUNTER — Encounter: Payer: 59 | Admitting: Obstetrics and Gynecology

## 2021-04-16 MED ORDER — NORETHINDRONE ACETATE 5 MG PO TABS: 10.0000 mg | ORAL_TABLET | Freq: Every day | ORAL | 3 refills | Status: DC

## 2021-04-16 NOTE — Telephone Encounter (Signed)
Medication has been sent. Patient notified via mychart.

## 2021-05-14 ENCOUNTER — Encounter: Payer: 59 | Admitting: Obstetrics and Gynecology

## 2021-06-27 ENCOUNTER — Other Ambulatory Visit: Payer: Self-pay

## 2021-06-27 ENCOUNTER — Ambulatory Visit (INDEPENDENT_AMBULATORY_CARE_PROVIDER_SITE_OTHER): Payer: 59 | Admitting: Obstetrics and Gynecology

## 2021-06-27 ENCOUNTER — Other Ambulatory Visit (HOSPITAL_COMMUNITY)
Admission: RE | Admit: 2021-06-27 | Discharge: 2021-06-27 | Disposition: A | Discharge status: Home / Self Care | Payer: 59 | Source: Ambulatory Visit | Attending: Obstetrics and Gynecology | Admitting: Obstetrics and Gynecology

## 2021-06-27 ENCOUNTER — Encounter: Payer: Self-pay | Admitting: Obstetrics and Gynecology

## 2021-06-27 VITALS — BP 114/58 | HR 83 | Resp 16 | Ht 60.5 in | Wt 124.5 lb

## 2021-06-27 DIAGNOSIS — Z1231 Encounter for screening mammogram for malignant neoplasm of breast: Secondary | ICD-10-CM | POA: Diagnosis not present

## 2021-06-27 DIAGNOSIS — Z124 Encounter for screening for malignant neoplasm of cervix: Secondary | ICD-10-CM | POA: Insufficient documentation

## 2021-06-27 DIAGNOSIS — Z01419 Encounter for gynecological examination (general) (routine) without abnormal findings: Secondary | ICD-10-CM

## 2021-06-27 DIAGNOSIS — Z8742 Personal history of other diseases of the female genital tract: Secondary | ICD-10-CM

## 2021-06-27 DIAGNOSIS — F419 Anxiety disorder, unspecified: Secondary | ICD-10-CM

## 2021-06-27 DIAGNOSIS — N809 Endometriosis, unspecified: Secondary | ICD-10-CM | POA: Diagnosis not present

## 2021-06-27 DIAGNOSIS — F32A Depression, unspecified: Secondary | ICD-10-CM
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
## 2021-06-27 NOTE — Patient Instructions (Addendum)

## 2021-06-28 ENCOUNTER — Encounter: Payer: Self-pay | Admitting: Obstetrics and Gynecology

## 2021-06-28 LAB — COMPREHENSIVE METABOLIC PANEL
ALT: 40 IU/L — ABNORMAL HIGH (ref 0–32)
AST: 24 IU/L (ref 0–40)
Albumin/Globulin Ratio: 2 (ref 1.2–2.2)
Albumin: 3.9 g/dL (ref 3.8–4.8)
Alkaline Phosphatase: 58 IU/L (ref 44–121)
BUN/Creatinine Ratio: 12 (ref 9–23)
BUN: 9 mg/dL (ref 6–24)
Bilirubin Total: <0.2 mg/dL (ref 0.0–1.2)
CO2: 24 mmol/L (ref 20–29)
Calcium: 8.6 mg/dL — ABNORMAL LOW (ref 8.7–10.2)
Chloride: 102 mmol/L (ref 96–106)
Creatinine, Ser: 0.75 mg/dL (ref 0.57–1.00)
Globulin, Total: 2 g/dL (ref 1.5–4.5)
Glucose: 122 mg/dL — ABNORMAL HIGH (ref 70–99)
Potassium: 3.8 mmol/L (ref 3.5–5.2)
Sodium: 139 mmol/L (ref 134–144)
Total Protein: 5.9 g/dL — ABNORMAL LOW (ref 6.0–8.5)
eGFR: 103 mL/min/{1.73_m2} (ref >59)

## 2021-06-28 LAB — LIPID PANEL
Chol/HDL Ratio: 7.9 ratio — ABNORMAL HIGH (ref 0.0–4.4)
Cholesterol, Total: 182 mg/dL (ref 100–199)
HDL: 23 mg/dL — ABNORMAL LOW (ref >39)
LDL Chol Calc (NIH): 145 mg/dL — ABNORMAL HIGH (ref 0–99)
Triglycerides: 74 mg/dL (ref 0–149)
VLDL Cholesterol Cal: 14 mg/dL (ref 5–40)

## 2021-06-28 LAB — CBC
Hematocrit: 47.8 % — ABNORMAL HIGH (ref 34.0–46.6)
Hemoglobin: 16.1 g/dL — ABNORMAL HIGH (ref 11.1–15.9)
MCH: 32.4 pg (ref 26.6–33.0)
MCHC: 33.7 g/dL (ref 31.5–35.7)
MCV: 96 fL (ref 79–97)
Platelets: 304 10*3/uL (ref 150–450)
RBC: 4.97 x10E6/uL (ref 3.77–5.28)
RDW: 11.9 % (ref 11.7–15.4)
WBC: 8.4 10*3/uL (ref 3.4–10.8)

## 2021-06-28 LAB — HEMOGLOBIN A1C
Est. average glucose Bld gHb Est-mCnc: 100 mg/dL
Hgb A1c MFr Bld: 5.1 % (ref 4.8–5.6)

## 2021-06-28 LAB — TSH: TSH: 0.863 u[IU]/mL (ref 0.450–4.500)

## 2021-07-08 ENCOUNTER — Encounter: Payer: Self-pay | Admitting: Obstetrics and Gynecology

## 2021-07-08 LAB — CYTOLOGY - PAP
Comment: NEGATIVE
Comment: NEGATIVE
Diagnosis: HIGH — AB
HPV 16: NEGATIVE
HPV 18 / 45: NEGATIVE
High risk HPV: POSITIVE — AB

## 2021-08-07 NOTE — Progress Notes (Signed)
? ? ? ?  GYNECOLOGY OFFICE COLPOSCOPY PROCEDURE NOTE ? ?41 y.o. G0P0000 here for colposcopy for high-grade squamous intraepithelial neoplasia  (HGSIL-encompassing moderate and severe dysplasia) pap smear on 06/27/21. Discussed role for HPV in cervical dysplasia, need for surveillance. ? ?Patient gave informed written consent, time out was performed.  Placed in lithotomy position. Cervix viewed with speculum and colposcope after application of acetic acid.  ? ?Colposcopy adequate? No ? acetowhite lesion(s) noted at 4-8 o'clock, and 12-1 o'clock, corresponding biopsies obtained.  ECC specimen obtained. ?All specimens were labeled and sent to pathology. ? ?Chaperone was present during entire procedure. ? ?Patient was given post procedure instructions.  Will follow up pathology and manage accordingly; patient will be contacted with results and recommendations.  Routine preventative health maintenance measures emphasized. ? ? ? ?[name], MD ?[facility] ? ? ?

## 2021-08-09 ENCOUNTER — Ambulatory Visit (INDEPENDENT_AMBULATORY_CARE_PROVIDER_SITE_OTHER): Payer: 59 | Admitting: Obstetrics and Gynecology

## 2021-08-09 ENCOUNTER — Encounter: Payer: Self-pay | Admitting: Obstetrics and Gynecology

## 2021-08-09 ENCOUNTER — Other Ambulatory Visit (HOSPITAL_COMMUNITY)
Admission: RE | Admit: 2021-08-09 | Discharge: 2021-08-09 | Disposition: A | Discharge status: Home / Self Care | Payer: 59 | Source: Ambulatory Visit | Attending: Obstetrics and Gynecology | Admitting: Obstetrics and Gynecology

## 2021-08-09 VITALS — BP 119/73 | HR 84 | Resp 16 | Ht 60.25 in | Wt 120.3 lb

## 2021-08-09 DIAGNOSIS — R87613 High grade squamous intraepithelial lesion on cytologic smear of cervix (HGSIL): Secondary | ICD-10-CM | POA: Diagnosis present

## 2021-08-09 NOTE — Patient Instructions (Signed)
Colposcopy, Care After  The following information offers guidance on how to care for yourself after your procedure. Your health care provider may also give you more specific instructions. If you have problems or questions, contact your health care provider. What can I expect after the procedure? If you had a colposcopy without a biopsy, you can expect to feel fine right away after your procedure. However, you may have some spotting of blood for a few days. You can return to your normal activities. If you had a colposcopy with a biopsy, it is common after the procedure to have: Soreness and mild pain. These may last for a few days. Mild vaginal bleeding or discharge that is dark-colored and grainy. This may last for a few days. The discharge may be caused by a liquid (solution) that was used during the procedure. You may need to wear a sanitary pad during this time. Spotting of blood for at least 48 hours after the procedure. Follow these instructions at home: Medicines Take over-the-counter and prescription medicines only as told by your health care provider. Talk with your health care provider about what type of over-the-counter pain medicines and prescription medicines you can start to take again. It is especially important to talk with your health care provider if you take blood thinners. Activity Avoid using douche products, using tampons, and having sex for at least 3 days after the procedure or for as long as told by your health care provider. Return to your normal activities as told by your health care provider. Ask your health care provider what activities are safe for you. General instructions Ask your health care provider if you may take baths, swim, or use a hot tub. You may take showers. If you use birth control (contraception), continue to use it. Keep all follow-up visits. This is important. Contact a health care provider if: You have a fever or chills. You faint or feel  light-headed. Get help right away if: You have heavy bleeding from your vagina or pass blood clots. Heavy bleeding is bleeding that soaks through a sanitary pad in less than 1 hour. You have vaginal discharge that is abnormal, is yellow in color, or smells bad. This could be a sign of infection. You have severe pain or cramps in your lower abdomen that do not go away with medicine. Summary If you had a colposcopy without a biopsy, you can expect to feel fine right away, but you may have some spotting of blood for a few days. You can return to your normal activities. If you had a colposcopy with a biopsy, it is common to have mild pain for a few days and spotting for 48 hours after the procedure. Avoid using douche products, using tampons, and having sex for at least 3 days after the procedure or for as long as told by your health care provider. Get help right away if you have heavy bleeding, severe pain, or signs of infection. This information is not intended to replace advice given to you by your health care provider. Make sure you discuss any questions you have with your health care provider. Document Revised: 09/02/2020 Document Reviewed: 09/02/2020 Elsevier Patient Education  2023 Elsevier Inc.  

## 2021-08-09 NOTE — Addendum Note (Signed)
Addended by: Chilton Greathouse on: 08/09/2021 04:48 PM ? ? Modules accepted: Orders ? ?

## 2021-08-13 ENCOUNTER — Encounter: Payer: Self-pay | Admitting: Obstetrics and Gynecology

## 2021-08-13 ENCOUNTER — Ambulatory Visit
Admission: RE | Admit: 2021-08-13 | Discharge: 2021-08-13 | Disposition: A | Discharge status: Home / Self Care | Payer: 59 | Source: Ambulatory Visit | Attending: Obstetrics and Gynecology | Admitting: Obstetrics and Gynecology

## 2021-08-13 DIAGNOSIS — Z01419 Encounter for gynecological examination (general) (routine) without abnormal findings: Secondary | ICD-10-CM | POA: Diagnosis present

## 2021-08-13 DIAGNOSIS — Z1231 Encounter for screening mammogram for malignant neoplasm of breast: Secondary | ICD-10-CM | POA: Insufficient documentation

## 2021-08-13 LAB — SURGICAL PATHOLOGY

## 2021-08-14 ENCOUNTER — Other Ambulatory Visit: Payer: Self-pay | Admitting: Obstetrics and Gynecology

## 2021-08-14 DIAGNOSIS — N6489 Other specified disorders of breast: Secondary | ICD-10-CM

## 2021-08-14 DIAGNOSIS — R928 Other abnormal and inconclusive findings on diagnostic imaging of breast: Secondary | ICD-10-CM

## 2021-08-15 ENCOUNTER — Other Ambulatory Visit: Payer: Self-pay | Admitting: Obstetrics and Gynecology

## 2021-08-15 DIAGNOSIS — R928 Other abnormal and inconclusive findings on diagnostic imaging of breast: Secondary | ICD-10-CM

## 2021-08-17 ENCOUNTER — Encounter: Payer: Self-pay | Admitting: Obstetrics and Gynecology

## 2021-08-22 NOTE — Progress Notes (Signed)
? ? ?  GYNECOLOGY PROGRESS NOTE ? ?Subjective:  ? ? Patient ID: Anita Hobbs, female    DOB: 15-Feb-1981, 41 y.o.   MRN: [id number] ? ?HPI ? Patient is a 41 y.o. G0P0000 female who presents for pre-operative visit for LEEP. 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 She has no concerns today.  ? ? ?The following portions of the patient's history were reviewed and updated as appropriate: allergies, current medications, past family history, past medical history, past social history, past surgical history, and problem list. ? ?Review of Systems ?Pertinent items noted in HPI and remainder of comprehensive ROS otherwise negative.  ? ?Objective:  ? Blood pressure 122/69, pulse 82, resp. rate 16, height 5' (1.524 m), weight 120 lb 1.6 oz (54.5 kg).  Body mass index is 23.46 kg/m?. ?General appearance: alert and no distress ?See H&P for remainder of exam.  ? ? ? ?Assessment:  ? ?1. HGSIL (high grade squamous intraepithelial lesion) on Pap smear of cervix   ?2. CIN III (cervical intraepithelial neoplasia grade III) with severe dysplasia   ?3. Preoperative exam for gynecologic surgery   ?  ? ?Plan:  ? ?1. HGSIL (high grade squamous intraepithelial lesion) on Pap smear of cervix ?- S/p colposcopy with CIN II-III ? ?2. CIN III (cervical intraepithelial neoplasia grade III) with severe dysplasia ?- Scheduled for LEEP 08/26/2021.  The risks of surgery were discussed in detail with the patient including but not limited to: bleeding which may require transfusion or reoperation; infection which may require prolonged hospitalization or re-hospitalization and antibiotic therapy; injury to bowel, bladder, ureters and major vessels or other surrounding organs which may lead to other procedures; formation of adhesions; need for additional procedures including laparotomy or subsequent procedures secondary to intraoperative injury or abnormal pathology; thromboembolic phenomenon; incisional problems and  other postoperative or anesthesia complications.  Printed patient education handouts about the procedure were given to the patient to review at home. ? ? ?3. Preoperative exam for gynecologic surgery ?- CBC; Standing ?  ? ?A total of 20 minutes were spent face-to-face with the patient during this encounter and over half of that time involved counseling and coordination of care. ? ?[name], MD ?[facility] ? ?

## 2021-08-23 ENCOUNTER — Ambulatory Visit (INDEPENDENT_AMBULATORY_CARE_PROVIDER_SITE_OTHER): Payer: 59 | Admitting: Obstetrics and Gynecology

## 2021-08-23 ENCOUNTER — Encounter: Payer: Self-pay | Admitting: Obstetrics and Gynecology

## 2021-08-23 ENCOUNTER — Encounter
Admission: RE | Admit: 2021-08-23 | Discharge: 2021-08-23 | Disposition: A | Discharge status: Home / Self Care | Payer: 59 | Source: Ambulatory Visit | Attending: Obstetrics and Gynecology | Admitting: Obstetrics and Gynecology

## 2021-08-23 ENCOUNTER — Other Ambulatory Visit: Payer: Self-pay

## 2021-08-23 VITALS — BP 122/69 | HR 82 | Resp 16 | Ht 60.0 in | Wt 120.1 lb

## 2021-08-23 DIAGNOSIS — Z01818 Encounter for other preprocedural examination: Secondary | ICD-10-CM

## 2021-08-23 DIAGNOSIS — R87613 High grade squamous intraepithelial lesion on cytologic smear of cervix (HGSIL): Secondary | ICD-10-CM

## 2021-08-23 DIAGNOSIS — D069 Carcinoma in situ of cervix, unspecified: Secondary | ICD-10-CM

## 2021-08-23 HISTORY — DX: Cardiac murmur, unspecified: R01.1

## 2021-08-23 NOTE — Patient Instructions (Signed)
Loop Electrosurgical Excision Procedure Loop electrosurgical excision procedure (LEEP) is the cutting and removal (excision) of tissue from the cervix. The cervix is the bottom part of the uterus that opens into the vagina. The tissue that is removed from the cervix is examined to see if there are cancer cells or cells that might turn into cancer (precancerous cells). LEEP may be done when: You have abnormal bleeding from your cervix. You have an abnormal Pap test result. Your health care provider finds abnormalities on your cervix during an exam. LEEP typically only takes a few minutes and is often done in the health care provider's office. The procedure is safe for women who are trying to get pregnant. The procedure is usually not done during a menstrual period or during pregnancy. Tell a health care provider about: Any allergies you have. All medicines you are taking, including vitamins, herbs, eye drops, creams, and over-the-counter medicines. Any problems you or family members have had with anesthetic medicines. Any bleeding problems you have. Any medical conditions you have or have had. This includes current or past vaginal infections, such as herpes or STIs (sexually transmitted infections). Whether you are pregnant or may be pregnant. If you are having vaginal bleeding on the day of the procedure. What are the risks? Generally, this is a safe procedure. However, problems may occur, including: Infection. Bleeding. Allergic reactions to medicines. Changes or scarring in the cervix. Damage to nearby structures or organs. Increased risk of early (preterm) labor in future pregnancies. What happens before the procedure? Ask your health care provider about: Changing or stopping your regular medicines. This is especially important if you are taking diabetes medicines or blood thinners. Taking medicines such as aspirin and ibuprofen. These medicines can thin your blood. Do not take these  medicines unless your health care provider tells you to take them. Taking over-the-counter medicines, vitamins, herbs, and supplements. Your health care provider may recommend that you take pain medicine before the procedure. Ask your health care provider if you should plan to have a responsible adult take you home after the procedure. What happens during the procedure?  An instrument called a speculum will be placed in your vagina. This will allow your health care provider to see your cervix. You will be given a medicine to numb the area (local anesthetic). The medicine will be injected into your cervix and the surrounding area. A solution will be applied to your cervix. This solution will help the health care provider find the abnormal cells that need to be removed. A thin wire loop will be passed through your vagina to your cervix. The wire will remove layers of abnormal cervical cells. The wire will burn (cauterize) the cervical tissue with an electrical current during cell removal. Open blood vessels will be cauterized to prevent bleeding. You might feel some pressure, aching, and cramping. If you feel like you will faint during the procedure, tell your health care provider right away. A paste may be applied to the cauterized area of your cervix to help control bleeding. The sample of cervical tissue will be sent to a lab and looked at under a microscope. The procedure may vary among health care providers and hospitals. What can I expect after the procedure? After the procedure, it is common to have: Mild abdominal cramps that may last for up to 1 week. A small amount of pink-tinged or bloody vaginal discharge, including light to moderate bleeding, for 1-2 weeks. A brown- or black-colored discharge coming from your   vagina, if a paste was used on the cervix to control bleeding. It is up to you to get the results of your procedure. Ask your health care provider, or the department that is  doing the procedure, when your results will be ready. Follow these instructions at home: Take over-the-counter and prescription medicines only as told by your health care provider. Return to your normal activities as told by your health care provider. Ask your health care provider what activities are safe for you. Do not put anything in your vagina for 2 weeks after the procedure or until your health care provider says that it is okay. This includes tampons, creams, and douches. Do not have sex until your health care provider approves. Keep all follow-up visits. This is important. Contact a health care provider if: You have a fever or chills. You feel very weak. You have blood clots or bleeding that is heavier than a normal menstrual period. Bleeding that soaks a pad in less than 1 hour is considered heavy bleeding. You develop a bad-smelling discharge from your vagina. You have severe abdominal pain or cramping. Summary Loop electrosurgical excision procedure (LEEP) is the removal of tissue from the cervix. The removed tissue will be checked for precancerous cells or cancer cells. LEEP typically only takes a few minutes and is often done in your health care provider's office. Do not put anything in your vagina for 2 weeks after the procedure or until your health care provider says that it is okay. This includes tampons, creams, and douches. Ask your health care provider, or the department that is doing the procedure, when your results will be ready. This information is not intended to replace advice given to you by your health care provider. Make sure you discuss any questions you have with your health care provider. Document Revised: 09/12/2020 Document Reviewed: 09/12/2020 Elsevier Patient Education  2023 Elsevier Inc.  

## 2021-08-23 NOTE — H&P (View-Only) (Signed)
? ? ? ?GYNECOLOGY PREOPERATIVE HISTORY AND PHYSICAL  ? ?Subjective:  ?Anita Hobbs is a 41 y.o. G0P0000 here for surgical management of moderate to severe cervical dysplasia.   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  No significant preoperative concerns. ? ?Proposed surgery: LEEP ? ? ?Pertinent Gynecological History: ?Menses: patient is not having cycles, on suppression with Aygestin ?Contraception: none ?Last pap: abnormal: HGSIL Date: 06/27/2021 ? ? ? ?Past Medical History:  ?Diagnosis Date  ? Anxiety   ? Depression   ? Dysmenorrhea   ? Endometriosis   ? History of substance abuse ([place])   ? Percocet  ? HLD (hyperlipidemia)   ? Infertility, female   ? Menorrhagia   ? Vitamin D deficiency   ? ? ?Past Surgical History:  ?Procedure Laterality Date  ? CYSTECTOMY    ? @ age 57yr, rt ovary   ? ? ?OB History  ?Gravida Para Term Preterm AB Living  ?0 0 0 0 0 0  ?SAB IAB Ectopic Multiple Live Births  ?0 0 0 0    ? ? ?Family History  ?Problem Relation Age of Onset  ? Obesity Mother   ?     ? cause of death sepsis   ? Depression Mother   ? Mental illness Mother   ? Diabetes Mother   ? Hypertension Mother   ? Miscarriages / Stillbirths Maternal Grandmother   ? Skin cancer Other   ? ? ?Social History  ? ?Socioeconomic History  ? Marital status: Significant Other  ?  Spouse name: Not on file  ? Number of children: Not on file  ? Years of education: Not on file  ? Highest education level: Not on file  ?Occupational History  ? Not on file  ?Tobacco Use  ? Smoking status: Every Day  ?  Packs/day: 0.50  ?  Years: 25.00  ?  Pack years: 12.50  ?  Types: Cigarettes  ? Smokeless tobacco: Never  ?Vaping Use  ? Vaping Use: Never used  ?Substance and Sexual Activity  ? Alcohol use: Not Currently  ? Drug use: Not Currently  ?  Comment: history of substance abuse (Percocet)  ? Sexual activity: Yes  ?  Birth control/protection: None, Pill  ?Other Topics Concern  ? Not on file  ?Social History  Narrative  ? 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 Smoker   ? DPR DLavina [phone] ? Divorced   ? Smoker since age 41y.o  ? ?Social Determinants of Health  ? ?Financial Resource Strain: Not on file  ?Food Insecurity: Not on file  ?Transportation Needs: Not on file  ?Physical Activity: Not on file  ?Stress: Not on file  ?Social Connections: Not on file  ?Intimate Partner Violence: Not on file  ? ? ?Current Outpatient Medications on File Prior to Visit  ?Medication Sig Dispense Refill  ? norethindrone (AYGESTIN) 5 MG tablet Take 2 tablets (10 mg total) by mouth daily. 180 tablet 3  ? Omega-3 Fatty Acids (FISH OIL) 1000 MG CAPS Take 1,000 mg by mouth daily.    ? ?No current facility-administered medications on file prior to visit.  ? ? ?No Known Allergies ? ? ? ?Review of Systems ?Constitutional: No recent fever/chills/sweats ?Respiratory: No recent cough/bronchitis ?Cardiovascular: No chest pain ?Gastrointestinal: No recent nausea/vomiting/diarrhea ?Genitourinary: No UTI symptoms ?Hematologic/lymphatic:No history of coagulopathy or recent blood thinner use  ?  ?Objective:  ? ?Blood pressure 122/69, pulse 82,  resp. rate 16, height 5' (1.524 m), weight 120 lb 1.6 oz (54.5 kg). ?CONSTITUTIONAL: Well-developed, well-nourished female in no acute distress.  ?HENT:  Normocephalic, atraumatic, External right and left ear normal. Oropharynx is clear and moist ?EYES: Conjunctivae and EOM are normal. Pupils are equal, round, and reactive to light. No scleral icterus.  ?NECK: Normal range of motion, supple, no masses ?SKIN: Skin is warm and dry. No rash noted. Not diaphoretic. No erythema. No pallor. ?NEUROLOGIC: Alert and oriented to person, place, and time. Normal reflexes, muscle tone coordination. No cranial nerve deficit noted. ?PSYCHIATRIC: Normal mood and affect. Normal behavior. Normal judgment and thought content. ?CARDIOVASCULAR: Normal heart rate noted, regular rhythm ?RESPIRATORY: Effort and breath sounds normal,  no problems with respiration noted ?ABDOMEN: Soft, nontender, nondistended. ?PELVIC: Deferred ?MUSCULOSKELETAL: Normal range of motion. No edema and no tenderness. 2+ distal pulses. ? ? ? ?Labs: ? ?Lab Results  ?Component Value Date  ? WBC 8.4 06/27/2021  ? HGB 16.1 (H) 06/27/2021  ? HCT 47.8 (H) 06/27/2021  ? MCV 96 06/27/2021  ? PLT 304 06/27/2021  ? ? ?Results for orders placed or performed in visit on 08/09/21 (from the past 336 hour(s))  ?Surgical pathology  ? Collection Time: 08/09/21  4:35 PM  ?Result Value Ref Range  ? SURGICAL PATHOLOGY    ?  SURGICAL PATHOLOGY ?CASE: [id number] ?PATIENT: Anita Hobbs ?Surgical Pathology Report ? ? ? ? ?Clinical History: HGSIL (cm) ? ? ? ? ?FINAL MICROSCOPIC DIAGNOSIS: ? ?A. ENDOCERVIX, CURETTAGE: ?- High-grade squamous intraepithelial lesion (CIN2-3, high grade ?dysplasia) ? ?B. CERVIX, 12 O'CLOCK, BIOPSY: ?- High-grade squamous intraepithelial lesion (CIN3, high grade ?dysplasia) with extension into underlying endocervical glands ? ?C. CERVIX, 6 O'CLOCK, BIOPSY: ?- High-grade squamous intraepithelial lesion (CIN3, high grade ?dysplasia) with extension into underlying endocervical glands ? ? ? ? ? ? ?GROSS DESCRIPTION: ? ?A: Received in formalin are 1 x 1 x 0.3 cm of blood-tinged mucus.  The ?specimen is submitted in toto. ? ?B: Received in formalin is a tan, soft tissue fragment that is submitted ?in toto.  Size: 0.3 cm, 1 block submitted. ? ?C: Received in formalin is a tan, soft tissue fragment that is submitted ?in toto.  Size: 0.3 cm, 1 block submitted.  (GRP 08/12/2021) ? ? ?Final  Diagnosis performed by Jaquita Folds, MD.   Electronically ?signed 08/13/2021 ?Technical component performed at [employer]. [facility], [address] [place], [place] [address]. ? Professional component performed at [facility], ?[place] [address] [place], [place] [address]. ? Immunohistochemistry Technical component (if applicable) was performed ?at  [facility] [place], [address], ?[place], [place] [address].   IMMUNOHISTOCHEMISTRY DISCLAIMER (if applicable): ?Some of these immunohistochemical stains may have been developed and the ?performance characteristics determine by [facility]. Some ?may not have been cleared or approved by the U.S. Food and Drug ?Administration. The FDA has determined that such clearance or approval ?is not necessary. This test is used for clinical purposes. It should not ?be regarded as investigational or for research. This laboratory is ?certified under the Clinical Laboratory Improvement A mendments of 1988 ?(CLIA-88) as qualified to perform high complexity clinical laboratory ?testing.  The controls stained appropriately. ?  ? ? ? ?Assessment:  ? ?1. HGSIL (high grade squamous intraepithelial lesion) on Pap smear of cervix   ?2. CIN III (cervical intraepithelial neoplasia grade III) with severe dysplasia   ?3. Preoperative exam for gynecologic surgery   ? ?Plan:  ? ?  Counseling: Procedure, risks, reasons, benefits and complications (including injury to bowel, bladder, major blood vessel, ureter, bleeding, possibility of transfusion, infection, or fistula formation) reviewed in detail. Likelihood of success in alleviating the patient's condition was discussed. Routine postoperative instructions will be reviewed with the patient and her family in detail after surgery.  The patient concurred with the proposed plan, giving informed written consent for the surgery.   ?Preop testing ordered. ?Instructions reviewed, including NPO after midnight. ?   ? ?[name], MD ?[facility] ? ? ?

## 2021-08-23 NOTE — H&P (Signed)
? ? ? ?GYNECOLOGY PREOPERATIVE HISTORY AND PHYSICAL  ? ?Subjective:  ?Anita Hobbs is a 41 y.o. G0P0000 here for surgical management of moderate to severe cervical dysplasia.   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  No significant preoperative concerns. ? ?Proposed surgery: LEEP ? ? ?Pertinent Gynecological History: ?Menses: patient is not having cycles, on suppression with Aygestin ?Contraception: none ?Last pap: abnormal: HGSIL Date: 06/27/2021 ? ? ? ?Past Medical History:  ?Diagnosis Date  ? Anxiety   ? Depression   ? Dysmenorrhea   ? Endometriosis   ? History of substance abuse ([place])   ? Percocet  ? HLD (hyperlipidemia)   ? Infertility, female   ? Menorrhagia   ? Vitamin D deficiency   ? ? ?Past Surgical History:  ?Procedure Laterality Date  ? CYSTECTOMY    ? @ age 89yr, rt ovary   ? ? ?OB History  ?Gravida Para Term Preterm AB Living  ?0 0 0 0 0 0  ?SAB IAB Ectopic Multiple Live Births  ?0 0 0 0    ? ? ?Family History  ?Problem Relation Age of Onset  ? Obesity Mother   ?     ? cause of death sepsis   ? Depression Mother   ? Mental illness Mother   ? Diabetes Mother   ? Hypertension Mother   ? Miscarriages / Stillbirths Maternal Grandmother   ? Skin cancer Other   ? ? ?Social History  ? ?Socioeconomic History  ? Marital status: Significant Other  ?  Spouse name: Not on file  ? Number of children: Not on file  ? Years of education: Not on file  ? Highest education level: Not on file  ?Occupational History  ? Not on file  ?Tobacco Use  ? Smoking status: Every Day  ?  Packs/day: 0.50  ?  Years: 25.00  ?  Pack years: 12.50  ?  Types: Cigarettes  ? Smokeless tobacco: Never  ?Vaping Use  ? Vaping Use: Never used  ?Substance and Sexual Activity  ? Alcohol use: Not Currently  ? Drug use: Not Currently  ?  Comment: history of substance abuse (Percocet)  ? Sexual activity: Yes  ?  Birth control/protection: None, Pill  ?Other Topics Concern  ? Not on file  ?Social History  Narrative  ? 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 Smoker   ? DPR DLavina [phone] ? Divorced   ? Smoker since age 41y.o  ? ?Social Determinants of Health  ? ?Financial Resource Strain: Not on file  ?Food Insecurity: Not on file  ?Transportation Needs: Not on file  ?Physical Activity: Not on file  ?Stress: Not on file  ?Social Connections: Not on file  ?Intimate Partner Violence: Not on file  ? ? ?Current Outpatient Medications on File Prior to Visit  ?Medication Sig Dispense Refill  ? norethindrone (AYGESTIN) 5 MG tablet Take 2 tablets (10 mg total) by mouth daily. 180 tablet 3  ? Omega-3 Fatty Acids (FISH OIL) 1000 MG CAPS Take 1,000 mg by mouth daily.    ? ?No current facility-administered medications on file prior to visit.  ? ? ?No Known Allergies ? ? ? ?Review of Systems ?Constitutional: No recent fever/chills/sweats ?Respiratory: No recent cough/bronchitis ?Cardiovascular: No chest pain ?Gastrointestinal: No recent nausea/vomiting/diarrhea ?Genitourinary: No UTI symptoms ?Hematologic/lymphatic:No history of coagulopathy or recent blood thinner use  ?  ?Objective:  ? ?Blood pressure 122/69, pulse 82,  resp. rate 16, height 5' (1.524 m), weight 120 lb 1.6 oz (54.5 kg). ?CONSTITUTIONAL: Well-developed, well-nourished female in no acute distress.  ?HENT:  Normocephalic, atraumatic, External right and left ear normal. Oropharynx is clear and moist ?EYES: Conjunctivae and EOM are normal. Pupils are equal, round, and reactive to light. No scleral icterus.  ?NECK: Normal range of motion, supple, no masses ?SKIN: Skin is warm and dry. No rash noted. Not diaphoretic. No erythema. No pallor. ?NEUROLOGIC: Alert and oriented to person, place, and time. Normal reflexes, muscle tone coordination. No cranial nerve deficit noted. ?PSYCHIATRIC: Normal mood and affect. Normal behavior. Normal judgment and thought content. ?CARDIOVASCULAR: Normal heart rate noted, regular rhythm ?RESPIRATORY: Effort and breath sounds normal,  no problems with respiration noted ?ABDOMEN: Soft, nontender, nondistended. ?PELVIC: Deferred ?MUSCULOSKELETAL: Normal range of motion. No edema and no tenderness. 2+ distal pulses. ? ? ? ?Labs: ? ?Lab Results  ?Component Value Date  ? WBC 8.4 06/27/2021  ? HGB 16.1 (H) 06/27/2021  ? HCT 47.8 (H) 06/27/2021  ? MCV 96 06/27/2021  ? PLT 304 06/27/2021  ? ? ?Results for orders placed or performed in visit on 08/09/21 (from the past 336 hour(s))  ?Surgical pathology  ? Collection Time: 08/09/21  4:35 PM  ?Result Value Ref Range  ? SURGICAL PATHOLOGY    ?  SURGICAL PATHOLOGY ?CASE: [id number] ?PATIENT: Elajah DURSO ?Surgical Pathology Report ? ? ? ? ?Clinical History: HGSIL (cm) ? ? ? ? ?FINAL MICROSCOPIC DIAGNOSIS: ? ?A. ENDOCERVIX, CURETTAGE: ?- High-grade squamous intraepithelial lesion (CIN2-3, high grade ?dysplasia) ? ?B. CERVIX, 12 O'CLOCK, BIOPSY: ?- High-grade squamous intraepithelial lesion (CIN3, high grade ?dysplasia) with extension into underlying endocervical glands ? ?C. CERVIX, 6 O'CLOCK, BIOPSY: ?- High-grade squamous intraepithelial lesion (CIN3, high grade ?dysplasia) with extension into underlying endocervical glands ? ? ? ? ? ? ?GROSS DESCRIPTION: ? ?A: Received in formalin are 1 x 1 x 0.3 cm of blood-tinged mucus.  The ?specimen is submitted in toto. ? ?B: Received in formalin is a tan, soft tissue fragment that is submitted ?in toto.  Size: 0.3 cm, 1 block submitted. ? ?C: Received in formalin is a tan, soft tissue fragment that is submitted ?in toto.  Size: 0.3 cm, 1 block submitted.  (GRP 08/12/2021) ? ? ?Final  Diagnosis performed by Jaquita Folds, MD.   Electronically ?signed 08/13/2021 ?Technical component performed at [employer]. [facility], [address] [place], [place] [address]. ? Professional component performed at [facility], ?[place] [address] [place], [place] [address]. ? Immunohistochemistry Technical component (if applicable) was performed ?at  [facility]. [address], ?[place], [place] [address].   IMMUNOHISTOCHEMISTRY DISCLAIMER (if applicable): ?Some of these immunohistochemical stains may have been developed and the ?performance characteristics determine by [facility]. Some ?may not have been cleared or approved by the U.S. Food and Drug ?Administration. The FDA has determined that such clearance or approval ?is not necessary. This test is used for clinical purposes. It should not ?be regarded as investigational or for research. This laboratory is ?certified under the Clinical Laboratory Improvement A mendments of 1988 ?(CLIA-88) as qualified to perform high complexity clinical laboratory ?testing.  The controls stained appropriately. ?  ? ? ? ?Assessment:  ? ?1. HGSIL (high grade squamous intraepithelial lesion) on Pap smear of cervix   ?2. CIN III (cervical intraepithelial neoplasia grade III) with severe dysplasia   ?3. Preoperative exam for gynecologic surgery   ? ?Plan:  ? ?  Counseling: Procedure, risks, reasons, benefits and complications (including injury to bowel, bladder, major blood vessel, ureter, bleeding, possibility of transfusion, infection, or fistula formation) reviewed in detail. Likelihood of success in alleviating the patient's condition was discussed. Routine postoperative instructions will be reviewed with the patient and her family in detail after surgery.  The patient concurred with the proposed plan, giving informed written consent for the surgery.   ?Preop testing ordered. ?Instructions reviewed, including NPO after midnight. ?   ? ?[name], MD ?[facility] ? ? ?

## 2021-08-23 NOTE — Patient Instructions (Addendum)
Your procedure is scheduled on: Monday 08/26/21 ?Report to the Registration Desk on the 1st floor of the [facility]. ?To find out your arrival time, please call [phone] between 1PM - 3PM on: Friday 08/23/21 ?If your arrival time is 6:00 am, do not arrive prior to that time as the [facility] entrance doors do not open until 6:00 am. ? ?REMEMBER: ?Instructions that are not followed completely may result in serious medical risk, up to and including death; or upon the discretion of your surgeon and anesthesiologist your surgery may need to be rescheduled. ? ?Do not eat or drink after midnight the night before surgery.  ?No gum chewing, lozengers or hard candies. ? ?TAKE THESE MEDICATIONS THE MORNING OF SURGERY WITH A SIP OF WATER: None ? ?One week prior to surgery: ?Stop Anti-inflammatories (NSAIDS) such as Advil, Aleve, Ibuprofen, Motrin, Naproxen, Naprosyn and Aspirin based products such as Excedrin, Goodys Powder, BC Powder. ? ?Stop taking your Omega-3 Fatty Acids (FISH OIL) 1000 MG CAPS and ANY OVER THE COUNTER supplements until after surgery. ? ?You may however, continue to take Tylenol if needed for pain up until the day of surgery. ? ?No Alcohol for 24 hours before or after surgery. ? ?No Smoking including e-cigarettes for 24 hours prior to surgery.  ?No chewable tobacco products for at least 6 hours prior to surgery.  ?No nicotine patches on the day of surgery. ? ?Do not use any "recreational" drugs for at least a week prior to your surgery.  ?Please be advised that the combination of cocaine and anesthesia may have negative outcomes, up to and including death. ?If you test positive for cocaine, your surgery will be cancelled. ? ?On the morning of surgery brush your teeth with toothpaste and water, you may rinse your mouth with mouthwash if you wish. ?Do not swallow any toothpaste or mouthwash. ? ?Do not wear jewelry, make-up, hairpins, clips or nail polish. ? ?Do not wear lotions, powders, or perfumes.   ? ?Do not shave body from the neck down 48 hours prior to surgery just in case you cut yourself which could leave a site for infection.  ?Also, freshly shaved skin may become irritated if using the CHG soap. ? ?Do not bring valuables to the hospital. [facility] is not responsible for any missing/lost belongings or valuables.  ? ?Notify your doctor if there is any change in your medical condition (cold, fever, infection). ? ?Wear comfortable clothing (specific to your surgery type) to the hospital. ? ?After surgery, you can help prevent lung complications by doing breathing exercises.  ?Take deep breaths and cough every 1-2 hours.  ? ?If you are being discharged the day of surgery, you will not be allowed to drive home. ?You will need a responsible adult (18 years or older) to drive you home and stay with you that night.  ? ?If you are taking public transportation, you will need to have a responsible adult (18 years or older) with you. ?Please confirm with your physician that it is acceptable to use public transportation.  ? ?Please call the [facility] at [phone] if you have any questions about these instructions. ? ?Surgery Visitation Policy: ? ?Patients undergoing a surgery or procedure may have two family members or support persons with them as long as the person is not COVID-19 positive or experiencing its symptoms.  ? ?Inpatient Visitation:   ? ?Visiting hours are 7 a.m. to 8 p.m. ?Up to four visitors are allowed at one  time in a patient room, including children. The visitors may rotate out with other people during the day. One designated support person (adult) may remain overnight.  ?

## 2021-08-26 ENCOUNTER — Other Ambulatory Visit: Payer: Self-pay

## 2021-08-26 ENCOUNTER — Encounter
Admission: RE | Disposition: A | Discharge status: Home / Self Care | Payer: Self-pay | Source: Home / Self Care | Attending: Obstetrics and Gynecology

## 2021-08-26 ENCOUNTER — Encounter: Payer: Self-pay | Admitting: Obstetrics and Gynecology

## 2021-08-26 ENCOUNTER — Ambulatory Visit: Payer: 59 | Admitting: Anesthesiology

## 2021-08-26 ENCOUNTER — Ambulatory Visit
Admission: RE | Admit: 2021-08-26 | Discharge: 2021-08-26 | Disposition: A | Discharge status: Home / Self Care | Payer: 59 | Attending: Obstetrics and Gynecology | Admitting: Obstetrics and Gynecology

## 2021-08-26 DIAGNOSIS — F17210 Nicotine dependence, cigarettes, uncomplicated: Secondary | ICD-10-CM | POA: Insufficient documentation

## 2021-08-26 DIAGNOSIS — D067 Carcinoma in situ of other parts of cervix: Secondary | ICD-10-CM | POA: Diagnosis not present

## 2021-08-26 DIAGNOSIS — R87613 High grade squamous intraepithelial lesion on cytologic smear of cervix (HGSIL): Secondary | ICD-10-CM

## 2021-08-26 DIAGNOSIS — D061 Carcinoma in situ of exocervix: Secondary | ICD-10-CM | POA: Diagnosis present

## 2021-08-26 DIAGNOSIS — D069 Carcinoma in situ of cervix, unspecified: Secondary | ICD-10-CM | POA: Diagnosis not present

## 2021-08-26 HISTORY — PX: LEEP: SHX91

## 2021-08-26 LAB — POCT PREGNANCY, URINE: Preg Test, Ur: NEGATIVE

## 2021-08-26 SURGERY — LEEP (LOOP ELECTROSURGICAL EXCISION PROCEDURE)
Anesthesia: General | Site: Vagina

## 2021-08-26 MED ORDER — KETOROLAC TROMETHAMINE 30 MG/ML IJ SOLN
INTRAMUSCULAR | Status: DC | PRN
  Administered 2021-08-26: 30 mg via INTRAVENOUS

## 2021-08-26 MED ORDER — LIDOCAINE-EPINEPHRINE 1 %-1:100000 IJ SOLN
INTRAMUSCULAR | Status: AC
  Filled 2021-08-26: qty 1

## 2021-08-26 MED ORDER — FERRIC SUBSULFATE 259 MG/GM EX SOLN
CUTANEOUS | Status: DC | PRN
  Administered 2021-08-26: 1 via TOPICAL

## 2021-08-26 MED ORDER — FENTANYL CITRATE (PF) 100 MCG/2ML IJ SOLN
INTRAMUSCULAR | Status: AC
  Filled 2021-08-26: qty 2

## 2021-08-26 MED ORDER — LIDOCAINE HCL (CARDIAC) PF 100 MG/5ML IV SOSY
PREFILLED_SYRINGE | INTRAVENOUS | Status: DC | PRN
Start: 2021-08-26 — End: 2021-08-26
  Administered 2021-08-26: 60 mg via INTRAVENOUS

## 2021-08-26 MED ORDER — PROPOFOL 10 MG/ML IV BOLUS
INTRAVENOUS | Status: DC | PRN
  Administered 2021-08-26: 150 mg via INTRAVENOUS

## 2021-08-26 MED ORDER — OXYCODONE HCL 5 MG PO TABS
5.0000 mg | ORAL_TABLET | Freq: Once | ORAL | Status: AC
  Administered 2021-08-26: 5 mg via ORAL

## 2021-08-26 MED ORDER — MIDAZOLAM HCL 2 MG/2ML IJ SOLN
INTRAMUSCULAR | Status: AC
  Filled 2021-08-26: qty 2

## 2021-08-26 MED ORDER — CHLORHEXIDINE GLUCONATE 0.12 % MT SOLN
OROMUCOSAL | Status: AC
  Administered 2021-08-26: 15 mL via OROMUCOSAL
  Filled 2021-08-26: qty 15

## 2021-08-26 MED ORDER — LIDOCAINE HCL (PF) 2 % IJ SOLN
INTRAMUSCULAR | Status: AC
  Filled 2021-08-26: qty 5

## 2021-08-26 MED ORDER — LACTATED RINGERS IV SOLN: INTRAVENOUS | Status: DC

## 2021-08-26 MED ORDER — IODINE STRONG (LUGOLS) 5 % PO SOLN
ORAL | Status: AC
  Filled 2021-08-26: qty 1

## 2021-08-26 MED ORDER — DEXAMETHASONE SODIUM PHOSPHATE 10 MG/ML IJ SOLN
INTRAMUSCULAR | Status: DC | PRN
  Administered 2021-08-26: 10 mg via INTRAVENOUS

## 2021-08-26 MED ORDER — FERRIC SUBSULFATE 259 MG/GM EX SOLN
CUTANEOUS | Status: AC
  Filled 2021-08-26: qty 8

## 2021-08-26 MED ORDER — POVIDONE-IODINE 10 % EX SWAB
2.0000 "application " | Freq: Once | CUTANEOUS | Status: AC
  Administered 2021-08-26: 2 via TOPICAL

## 2021-08-26 MED ORDER — OXYCODONE HCL 5 MG PO TABS
ORAL_TABLET | ORAL | Status: AC
  Filled 2021-08-26: qty 1

## 2021-08-26 MED ORDER — LACTATED RINGERS IV SOLN
INTRAVENOUS | Status: DC
Start: 2021-08-26 — End: 2021-08-26

## 2021-08-26 MED ORDER — IBUPROFEN 600 MG PO TABS: 600.0000 mg | ORAL_TABLET | Freq: Four times a day (QID) | ORAL | 0 refills | Status: DC | PRN

## 2021-08-26 MED ORDER — IODINE STRONG (LUGOLS) 5 % PO SOLN
ORAL | Status: DC | PRN
  Administered 2021-08-26: 0.2 mL via ORAL

## 2021-08-26 MED ORDER — ONDANSETRON HCL 4 MG/2ML IJ SOLN
INTRAMUSCULAR | Status: DC | PRN
  Administered 2021-08-26: 4 mg via INTRAVENOUS

## 2021-08-26 MED ORDER — LIDOCAINE HCL (PF) 1 % IJ SOLN
INTRAMUSCULAR | Status: AC
  Filled 2021-08-26: qty 30

## 2021-08-26 MED ORDER — 0.9 % SODIUM CHLORIDE (POUR BTL) OPTIME
TOPICAL | Status: DC | PRN
Start: 2021-08-26 — End: 2021-08-26
  Administered 2021-08-26: 500 mL

## 2021-08-26 MED ORDER — FENTANYL CITRATE (PF) 100 MCG/2ML IJ SOLN
INTRAMUSCULAR | Status: DC | PRN
  Administered 2021-08-26 (×4): 25 ug via INTRAVENOUS

## 2021-08-26 MED ORDER — CHLORHEXIDINE GLUCONATE 0.12 % MT SOLN: 15.0000 mL | Freq: Once | OROMUCOSAL | Status: AC

## 2021-08-26 MED ORDER — MIDAZOLAM HCL 2 MG/2ML IJ SOLN
INTRAMUSCULAR | Status: DC | PRN
  Administered 2021-08-26 (×2): 1 mg via INTRAVENOUS

## 2021-08-26 MED ORDER — FENTANYL CITRATE (PF) 100 MCG/2ML IJ SOLN: 25.0000 ug | INTRAMUSCULAR | Status: DC | PRN

## 2021-08-26 MED ORDER — ORAL CARE MOUTH RINSE: 15.0000 mL | Freq: Once | OROMUCOSAL | Status: AC

## 2021-08-26 MED ORDER — LIDOCAINE HCL 1 % IJ SOLN
INTRAMUSCULAR | Status: DC | PRN
  Administered 2021-08-26: 10 mL

## 2021-08-26 MED ORDER — PROPOFOL 10 MG/ML IV BOLUS
INTRAVENOUS | Status: AC
  Filled 2021-08-26: qty 40

## 2021-08-26 MED ORDER — FAMOTIDINE 20 MG PO TABS
20.0000 mg | ORAL_TABLET | Freq: Once | ORAL | Status: AC
  Administered 2021-08-26: 20 mg via ORAL

## 2021-08-26 MED ORDER — ONDANSETRON HCL 4 MG/2ML IJ SOLN: 4.0000 mg | Freq: Once | INTRAMUSCULAR | Status: DC | PRN

## 2021-08-26 MED ORDER — ONDANSETRON HCL 4 MG/2ML IJ SOLN
INTRAMUSCULAR | Status: AC
  Filled 2021-08-26: qty 2

## 2021-08-26 MED ORDER — ACETAMINOPHEN 500 MG PO TABS
ORAL_TABLET | ORAL | Status: AC
  Administered 2021-08-26: 1000 mg via ORAL
  Filled 2021-08-26: qty 2

## 2021-08-26 MED ORDER — ACETAMINOPHEN 500 MG PO TABS: 1000.0000 mg | ORAL_TABLET | ORAL | Status: AC

## 2021-08-26 SURGICAL SUPPLY — 36 items
APL SWBSTK 6 STRL LF DISP (MISCELLANEOUS) ×1
APPLICATOR COTTON TIP 6 STRL (MISCELLANEOUS) ×1 IMPLANT
APPLICATOR COTTON TIP 6IN STRL (MISCELLANEOUS) ×2
APPLICATOR SWAB PROCTO LG 16IN (MISCELLANEOUS) ×2 IMPLANT
BACTOSHIELD CHG 4% 4OZ (MISCELLANEOUS) ×1
DRAPE UNDER BUTTOCK W/FLU (DRAPES) ×2 IMPLANT
DRSG TELFA 3X8 NADH (GAUZE/BANDAGES/DRESSINGS) ×2 IMPLANT
ELECT LEEP LOOP 1.0CM .7CM (MISCELLANEOUS) ×2
ELECT LEEP LOOP 20X10 R2010 (MISCELLANEOUS) ×2
ELECT LOOP 1.0X1.0CM R1010 (MISCELLANEOUS) ×2
ELECT REM PT RETURN 9FT ADLT (ELECTROSURGICAL) ×2
ELECTRODE LEEP LOOP 1.0CM .7CM (MISCELLANEOUS) ×1 IMPLANT
ELECTRODE LEP LOOP 20X10 R2010 (MISCELLANEOUS) ×1 IMPLANT
ELECTRODE LOOP 1.0X1.0CM R1010 (MISCELLANEOUS) ×1 IMPLANT
ELECTRODE REM PT RTRN 9FT ADLT (ELECTROSURGICAL) ×1 IMPLANT
GLOVE BIO SURGEON STRL SZ 6.5 (GLOVE) ×2 IMPLANT
GLOVE SURG UNDER LTX SZ7 (GLOVE) ×2 IMPLANT
GOWN STRL REUS W/ TWL LRG LVL3 (GOWN DISPOSABLE) ×2 IMPLANT
GOWN STRL REUS W/TWL LRG LVL3 (GOWN DISPOSABLE) ×4
HANDLE YANKAUER SUCT BULB TIP (MISCELLANEOUS) ×2 IMPLANT
KIT TURNOVER CYSTO (KITS) ×2 IMPLANT
LABEL OR SOLS (LABEL) ×2 IMPLANT
MANIFOLD NEPTUNE II (INSTRUMENTS) ×2 IMPLANT
NDL SPNL 22GX3.5 QUINCKE BK (NEEDLE) ×1 IMPLANT
NEEDLE SPNL 22GX3.5 QUINCKE BK (NEEDLE) ×2 IMPLANT
PACK DNC HYST (MISCELLANEOUS) ×2 IMPLANT
PAD DRESSING TELFA 3X8 NADH (GAUZE/BANDAGES/DRESSINGS) ×1 IMPLANT
PAD PREP 24X41 OB/GYN DISP (PERSONAL CARE ITEMS) ×2 IMPLANT
PENCIL ELECTRO HAND CTR (MISCELLANEOUS) ×2 IMPLANT
SCRUB CHG 4% DYNA-HEX 4OZ (MISCELLANEOUS) ×1 IMPLANT
SET CYSTO W/LG BORE CLAMP LF (SET/KITS/TRAYS/PACK) IMPLANT
SOL PREP PVP 2OZ (MISCELLANEOUS) ×2
SOLUTION PREP PVP 2OZ (MISCELLANEOUS) ×1 IMPLANT
STRAW SMOKE EVAC LEEP 6150 NON (MISCELLANEOUS) ×2 IMPLANT
SYR 10ML LL (SYRINGE) ×2 IMPLANT
WATER STERILE IRR 500ML POUR (IV SOLUTION) ×2 IMPLANT

## 2021-08-26 NOTE — Op Note (Signed)
Procedure(s): ?LOOP ELECTROSURGICAL EXCISION PROCEDURE (LEEP) Procedure Note ? ?Anita Hobbs ?female ?41 y.o. ?08/26/2021 ? ?Indications: The patient is a 41 y.o. G0P0000 female with moderate to severe cervical dysplasia. History of HGSIL pap smear on 07/07/2021. 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 ? ?Pre-operative Diagnosis: CIN II-III ? ?Post-operative Diagnosis: Same ? ?Surgeon: [name], MD ? ?Assistants:  None.  ? ?Anesthesia: General endotracheal anesthesia ? ?Findings: Cervix with decreased uptake at the 6 and 12 o'clock positions.  ? ?Procedure Details: ?The patient was seen in the Holding Room. The risks, benefits, complications, treatment options, and expected outcomes were discussed with the patient.  The patient concurred with the proposed plan, giving informed consent.  The site of surgery properly noted/marked. The patient was taken to the Operating Room, identified as Anita Hobbs and the procedure verified as Procedure(s) (LRB): LOOP ELECTROSURGICAL EXCISION PROCEDURE (LEEP) (N/A). A Time Out was held and the above information confirmed. ? ?She was then placed under general anesthesia without difficulty. She was placed in the dorsal lithotomy position using candy cane stirrups, and was prepped and draped in a sterile manner.  A straight catheterization was performed. A sterile coated speculum was inserted into the vagina. Lugol's solution was applied to the cervix and areas of decreased uptake were noted around the transformation zone.   Local anesthesia was administered via an intracervical block using 10 ml of 1% Lidocaine. The suction was turned on and the Medium 1X Fisher Cone Biopsy Excisor on 15 Watts of blended current was used to excise the area of decreased uptake and excise the entire transformation zone. A second small Fisher Cone Biopsy was used to excise an area of the endocervix.  An endocervical curettage was then performed. Excellent  hemostasis was achieved using roller ball coagulation set at 60 Watts coagulation current. Monsel's solution was then applied and the speculum was removed from the vagina. Specimens were sent to pathology. ? ??The patient tolerated the procedure well. Post-operative instructions given to patient, including instruction to seek medical attention for persistent bright red bleeding, fever, abdominal/pelvic pain, dysuria, nausea or vomiting. She was also told about the possibility of having copious yellow to black tinged discharge for weeks. She was counseled to avoid anything in the vagina (sex/douching/tampons) for 3 weeks. She has a 4 week post-operative check to assess wound healing, review results and discuss further management.  ? ? ?Estimated Blood Loss:  5 ml ?     ?Drains: straight catheterization prior to procedure with 50 ml of clear urine ?        ?Total IV Fluids:  400 ml ? ?Specimens: Ectocervix (tagged at 12 o'clock), endocervix, and endocervical curettings ?        ?Implants: None ?        ?Complications:  None; patient tolerated the procedure well. ?        ?Disposition: PACU - hemodynamically stable. ?        ?Condition: stable ? ? ?[name], MD ?[facility] ? ?

## 2021-08-26 NOTE — Transfer of Care (Signed)
Immediate Anesthesia Transfer of Care Note ? ?Patient: Anita Hobbs ? ?Procedure(s) Performed: LOOP ELECTROSURGICAL EXCISION PROCEDURE (LEEP) (Vagina ) ? ?Patient Location: PACU ? ?Anesthesia Type:General ? ?Level of Consciousness: drowsy ? ?Airway & Oxygen Therapy: Patient Spontanous Breathing and Patient connected to face mask oxygen ? ?Post-op Assessment: Report given to RN and Post -op Vital signs reviewed and stable ? ?Post vital signs: Reviewed and stable ? ?Last Vitals:  ?Vitals Value Taken Time  ?BP    ?Temp    ?Pulse    ?Resp    ?SpO2    ? ? ?Last Pain:  ?Vitals:  ? 08/26/21 1117  ?TempSrc: Tympanic  ?PainSc: 0-No pain  ?   ? ?Patients Stated Pain Goal: 0 (08/26/21 1117) ? ?Complications: No notable events documented. ?

## 2021-08-26 NOTE — Discharge Instructions (Addendum)
General Gynecological Post-Operative Instructions ?You may expect to feel dizzy, weak, and drowsy for as long as 24 hours after receiving the medicine that made you sleep (anesthetic).  ?Do not drive a car, ride a bicycle, participate in physical activities, or take public transportation until you are done taking narcotic pain medicines or as directed by your doctor.  ?Do not drink alcohol or take tranquilizers.  ?Do not take medicine that has not been prescribed by your doctor.  ?Do not sign important papers or make important decisions while on narcotic pain medicines.  ?Have a responsible person with you.  ?CARE OF INCISION  ?Keep incision clean and dry. ?Take showers instead of baths until your doctor gives you permission to take baths.  ?Avoid heavy lifting (more than 10 pounds/4.5 kilograms), pushing, or pulling.  ?Avoid activities that may risk injury to your surgical site.  ?No sexual intercourse or placement of anything in the vagina for 4 weeks or as instructed by your doctor. ?If you have tubes coming from the wound site, check with your doctor regarding appropriate care of the tubes. ?Only take prescription or over-the-counter medicines  for pain, discomfort, or fever as directed by your doctor. Do not take aspirin. It can make you bleed. Take medicines (antibiotics) that kill germs if they are prescribed for you.  ?Call the office or go to the Emergency Room if:  ?You feel sick to your stomach (nauseous).  ?You start to throw up (vomit).  ?You have trouble eating or drinking.  ?You have an oral temperature above 101.  ?You have constipation that is not helped by adjusting diet or increasing fluid intake. Pain medicines are a common cause of constipation.  ?You have any other concerns. ?SEEK IMMEDIATE MEDICAL CARE IF:  ?You have persistent dizziness.  ?You have difficulty breathing or a congested sounding (croupy) cough.  ?You have an oral temperature above 102.5, not controlled by medicine.  ?There is  increasing pain or tenderness near or in the surgical site.  ?AMBULATORY SURGERY  ?DISCHARGE INSTRUCTIONS ? ? ?The drugs that you were given will stay in your system until tomorrow so for the next 24 hours you should not: ? ?Drive an automobile ?Make any legal decisions ?Drink any alcoholic beverage ? ? ?You may resume regular meals tomorrow.  Today it is better to start with liquids and gradually work up to solid foods. ? ?You may eat anything you prefer, but it is better to start with liquids, then soup and crackers, and gradually work up to solid foods. ? ? ?Please notify your doctor immediately if you have any unusual bleeding, trouble breathing, redness and pain at the surgery site, drainage, fever, or pain not relieved by medication. ?  ? ?Your post-operative visit with Dr.                     ? ? ?           ?     is: Date:                        Time:   ? ?Please call to schedule your post-operative visit. ? ?Additional Instructions:  ?

## 2021-08-26 NOTE — Anesthesia Preprocedure Evaluation (Signed)
Anesthesia Evaluation  ?Patient identified by MRN, date of birth, ID band ?Patient awake ? ? ? ?Reviewed: ?Allergy & Precautions, H&P , NPO status , Patient's Chart, lab work & pertinent test results, reviewed documented beta blocker date and time  ? ?Airway ?Mallampati: II ? ?TM Distance: >3 FB ?Neck ROM: full ? ? ? Dental ? ?(+) Teeth Intact ?  ?Pulmonary ?neg pulmonary ROS, Current Smoker,  ?  ?Pulmonary exam normal ? ? ? ? ? ? ? Cardiovascular ?Exercise Tolerance: Good ?Normal cardiovascular exam+ Valvular Problems/Murmurs  ?Rate:Normal ? ? ?  ?Neuro/Psych ?PSYCHIATRIC DISORDERS Anxiety Depression negative neurological ROS ?   ? GI/Hepatic ?negative GI ROS, Neg liver ROS,   ?Endo/Other  ?negative endocrine ROS ? Renal/GU ?negative Renal ROS  ?negative genitourinary ?  ?Musculoskeletal ? ? Abdominal ?  ?Peds ? Hematology ?negative hematology ROS ?(+)   ?Anesthesia Other Findings ? ? Reproductive/Obstetrics ?negative OB ROS ? ?  ? ? ? ? ? ? ? ? ? ? ? ? ? ?  ?  ? ? ? ? ? ? ? ? ?Anesthesia Physical ?Anesthesia Plan ? ?ASA: 2 ? ?Anesthesia Plan: General LMA  ? ?Post-op Pain Management:   ? ?Induction:  ? ?PONV Risk Score and Plan: 3 ? ?Airway Management Planned:  ? ?Additional Equipment:  ? ?Intra-op Plan:  ? ?Post-operative Plan:  ? ?Informed Consent: I have reviewed the patients History and Physical, chart, labs and discussed the procedure including the risks, benefits and alternatives for the proposed anesthesia with the patient or authorized representative who has indicated his/her understanding and acceptance.  ? ? ? ? ? ?Plan Discussed with: CRNA ? ?Anesthesia Plan Comments:   ? ? ? ? ? ? ?Anesthesia Quick Evaluation ? ?

## 2021-08-26 NOTE — Progress Notes (Signed)
Awake/alert x4, tolerated apple juice and crackers, vitals stable: baseline for patient  verbalizes understanding of procedure and follow up plan of care. ?

## 2021-08-26 NOTE — Anesthesia Procedure Notes (Signed)
Procedure Name: LMA Insertion ?Date/Time: 08/26/2021 1:04 PM ?Performed by: Loletha Grayer, CRNA ?Pre-anesthesia Checklist: Patient identified, Patient being monitored, Timeout performed, Emergency Drugs available and Suction available ?Patient Re-evaluated:Patient Re-evaluated prior to induction ?Oxygen Delivery Method: Circle system utilized ?Preoxygenation: Pre-oxygenation with 100% oxygen ?Induction Type: IV induction ?Ventilation: Mask ventilation without difficulty ?LMA: LMA inserted ?LMA Size: 3.0 ?Number of attempts: 1 ?Placement Confirmation: positive ETCO2 ?Tube secured with: Tape ?Dental Injury: Teeth and Oropharynx as per pre-operative assessment  ? ? ? ? ?

## 2021-08-26 NOTE — Interval H&P Note (Signed)
History and Physical Interval Note: ? ?08/26/2021 ?12:03 PM ? ?Anita Hobbs  has presented today for surgery, with the diagnosis of Severe Cervical Dysplasia.  The various methods of treatment have been discussed with the patient and family. After consideration of risks, benefits and other options for treatment, the patient has consented to  Procedure(s): LOOP ELECTROSURGICAL EXCISION PROCEDURE (LEEP) (N/A) as a surgical intervention.  The patient's history has been reviewed, patient examined, no change in status, stable for surgery.  I have reviewed the patient's chart and labs.  Questions were answered to the patient's satisfaction.   ? ? ?[name], MD ?[facility] ? ? ?

## 2021-08-27 ENCOUNTER — Encounter: Payer: Self-pay | Admitting: Obstetrics and Gynecology

## 2021-08-27 NOTE — Anesthesia Postprocedure Evaluation (Signed)
Anesthesia Post Note ? ?Patient: Anita Hobbs ? ?Procedure(s) Performed: LOOP ELECTROSURGICAL EXCISION PROCEDURE (LEEP) (Vagina ) ? ?Patient location during evaluation: PACU ?Anesthesia Type: General ?Level of consciousness: awake and alert ?Pain management: pain level controlled ?Vital Signs Assessment: post-procedure vital signs reviewed and stable ?Respiratory status: spontaneous breathing, nonlabored ventilation, respiratory function stable and patient connected to nasal cannula oxygen ?Cardiovascular status: blood pressure returned to baseline and stable ?Postop Assessment: no apparent nausea or vomiting ?Anesthetic complications: no ? ? ?No notable events documented. ? ? ?Last Vitals:  ?Vitals:  ? 08/26/21 1415 08/26/21 1440  ?BP: 123/76 117/64  ?Pulse: 65 63  ?Resp: 15 18  ?Temp: 36.4 ?C (!) 36.2 ?C  ?SpO2: 99% 100%  ?  ?Last Pain:  ?Vitals:  ? 08/26/21 1440  ?TempSrc: Temporal  ?PainSc: 0-No pain  ? ? ?  ?  ?  ?  ?  ?  ? ?Molli Barrows ? ? ? ? ?

## 2021-08-28 ENCOUNTER — Encounter: Payer: Self-pay | Admitting: Obstetrics and Gynecology

## 2021-08-28 LAB — SURGICAL PATHOLOGY

## 2021-09-02 ENCOUNTER — Ambulatory Visit
Admission: RE | Admit: 2021-09-02 | Discharge: 2021-09-02 | Disposition: A | Discharge status: Home / Self Care | Payer: 59 | Source: Ambulatory Visit | Attending: Obstetrics and Gynecology | Admitting: Obstetrics and Gynecology

## 2021-09-02 DIAGNOSIS — N6489 Other specified disorders of breast: Secondary | ICD-10-CM | POA: Insufficient documentation

## 2021-09-02 DIAGNOSIS — R928 Other abnormal and inconclusive findings on diagnostic imaging of breast: Secondary | ICD-10-CM | POA: Insufficient documentation

## 2021-09-18 ENCOUNTER — Encounter: Payer: Self-pay | Admitting: Obstetrics and Gynecology

## 2021-09-18 ENCOUNTER — Ambulatory Visit: Payer: 59 | Admitting: Obstetrics and Gynecology

## 2021-09-18 VITALS — BP 120/72 | HR 72 | Resp 16 | Ht 60.0 in | Wt 125.3 lb

## 2021-09-18 DIAGNOSIS — Z4889 Encounter for other specified surgical aftercare: Secondary | ICD-10-CM

## 2021-09-18 DIAGNOSIS — D069 Carcinoma in situ of cervix, unspecified: Secondary | ICD-10-CM

## 2021-09-18 DIAGNOSIS — Z9889 Other specified postprocedural states: Secondary | ICD-10-CM

## 2021-09-18 NOTE — Progress Notes (Signed)
    OBSTETRICS/GYNECOLOGY POST-OPERATIVE CLINIC VISIT  Subjective:     Anita Hobbs is a 41 y.o. P0 female who presents to the clinic 3 weeks status post  Loop Electrosurgical Excision Procedure  for severe cervical dysplasia. Eating a regular diet without difficulty. Bowel movements are normal. The patient is not having any pain. Reports that she did have intercourse this past weekend, 1 time.   The following portions of the patient's history were reviewed and updated as appropriate: allergies, current medications, past family history, past medical history, past social history, past surgical history, and problem list.  Review of Systems Pertinent items noted in HPI and remainder of comprehensive ROS otherwise negative.   Objective:   BP 120/72   Pulse 72   Resp 16   Ht 5' (1.524 m)   Wt 125 lb 4.8 oz (56.8 kg)   BMI 24.47 kg/m  Body mass index is 24.47 kg/m.  General:  alert and no distress  Abdomen: soft, bowel sounds active, non-tender  Incision:   healing well, no drainage, no erythema, no hernia, no seroma, no swelling, no dehiscence, incision well approximated    Pathology:   A. ECTOCERVIX; LEEP:  - HIGH-GRADE SQUAMOUS INTRAEPITHELIAL LESION (HSIL, CIN 3).  - HSIL IS CIRCUMFERENTIAL.  - HSIL INVOLVES ECTOCERVICAL AND ENDOCERVICAL MARGINS.  - HSIL EXTENSIVELY INVOLVES ENDOCERVICAL GLANDS.  - NEGATIVE FOR INVASIVE SQUAMOUS CELL CARCINOMA.   B. ENDOCERVIX; LEEP:  - HSIL WITH INVOLVEMENT OF AN INKED CAUTERIZED MARGIN, TISSUE NOT  ORIENTED.  - NEGATIVE FOR INVASIVE SQUAMOUS CELL CARCINOMA.   C. ENDOCERVIX; CURETTAGE:  - SCANT ATYPICAL SQUAMOUS EPITHELIUM, SUSPICIOUS FOR HSIL BUT NOT  DIAGNOSTIC.   Assessment:   Patient s/p Loop Electrosurgical Excision Procedure (surgery)  Doing well postoperatively. CIN III   Plan:   1. Continue any current medications as instructed by provider.  2. Wound care discussed. 3. Operative findings again reviewed. Pathology  report discussed. 4. Activity restrictions: none. Advised on pelvic rest x 1 week.  5. Anticipated return to work: not applicable. 6. Follow up: 4-6  months  for repeat pap smear.     [name], MD [facility]

## 2021-11-18 ENCOUNTER — Encounter: Payer: Self-pay | Admitting: Obstetrics and Gynecology

## 2021-11-19 ENCOUNTER — Other Ambulatory Visit: Payer: Self-pay

## 2021-11-21 ENCOUNTER — Encounter: Payer: Self-pay | Admitting: Obstetrics and Gynecology

## 2021-11-21 ENCOUNTER — Other Ambulatory Visit: Payer: Self-pay

## 2021-11-21 DIAGNOSIS — N6489 Other specified disorders of breast: Secondary | ICD-10-CM

## 2022-03-03 ENCOUNTER — Encounter: Payer: Self-pay | Admitting: Obstetrics and Gynecology

## 2022-03-06 ENCOUNTER — Ambulatory Visit
Admission: RE | Admit: 2022-03-06 | Discharge: 2022-03-06 | Disposition: A | Discharge status: Home / Self Care | Payer: 59 | Source: Ambulatory Visit | Attending: Obstetrics and Gynecology | Admitting: Obstetrics and Gynecology

## 2022-03-06 DIAGNOSIS — N6489 Other specified disorders of breast: Secondary | ICD-10-CM | POA: Diagnosis present

## 2022-04-18 ENCOUNTER — Encounter: Payer: Self-pay | Admitting: Emergency Medicine

## 2022-04-18 ENCOUNTER — Ambulatory Visit
Admission: EM | Admit: 2022-04-18 | Discharge: 2022-04-18 | Disposition: A | Discharge status: Home / Self Care | Payer: 59 | Attending: Family Medicine | Admitting: Family Medicine

## 2022-04-18 ENCOUNTER — Ambulatory Visit: Payer: Self-pay

## 2022-04-18 DIAGNOSIS — Z1152 Encounter for screening for COVID-19: Secondary | ICD-10-CM | POA: Insufficient documentation

## 2022-04-18 DIAGNOSIS — F17210 Nicotine dependence, cigarettes, uncomplicated: Secondary | ICD-10-CM | POA: Insufficient documentation

## 2022-04-18 DIAGNOSIS — Z20828 Contact with and (suspected) exposure to other viral communicable diseases: Secondary | ICD-10-CM | POA: Insufficient documentation

## 2022-04-18 DIAGNOSIS — R509 Fever, unspecified: Secondary | ICD-10-CM | POA: Diagnosis present

## 2022-04-18 DIAGNOSIS — J111 Influenza due to unidentified influenza virus with other respiratory manifestations: Secondary | ICD-10-CM | POA: Insufficient documentation

## 2022-04-18 DIAGNOSIS — R059 Cough, unspecified: Secondary | ICD-10-CM | POA: Diagnosis present

## 2022-04-18 LAB — SARS CORONAVIRUS 2 BY RT PCR: SARS Coronavirus 2 by RT PCR: NEGATIVE

## 2022-04-18 MED ORDER — PROMETHAZINE-DM 6.25-15 MG/5ML PO SYRP: 5.0000 mL | ORAL_SOLUTION | Freq: Four times a day (QID) | ORAL | 0 refills | Status: DC | PRN

## 2022-04-18 MED ORDER — BENZONATATE 100 MG PO CAPS: 200.0000 mg | ORAL_CAPSULE | Freq: Three times a day (TID) | ORAL | 0 refills | Status: DC | PRN

## 2022-04-18 NOTE — ED Triage Notes (Signed)
Patient c/o cough, congestion, bodyaches, and fever that started on Tuesday.

## 2022-04-18 NOTE — Discharge Instructions (Addendum)
Your COVID test is negative.  I suspect you have the flu. Stop by the pharmacy to pick up your medications. You can take Tylenol and/or Ibuprofen as needed for fever reduction and pain relief.    For cough: honey 1/2 to 1 teaspoon (you can dilute the honey in water or another fluid).  You can also use guaifenesin and dextromethorphan for cough. You can use a humidifier for chest congestion and cough.  If you don't have a humidifier, you can sit in the bathroom with the hot shower running.      For sore throat: try warm salt water gargles, Mucinex sore throat cough drops or cepacol lozenges, throat spray, warm tea or water with lemon/honey, popsicles or ice, or OTC cold relief medicine for throat discomfort. You can also purchase chloraseptic spray at the pharmacy or dollar store.   For congestion: take a daily anti-histamine like Zyrtec, Claritin, and a oral decongestant, such as pseudoephedrine.  You can also use Flonase 1-2 sprays in each nostril daily. Afrin is also a good option, if you do not have high blood pressure.    It is important to stay hydrated: drink plenty of fluids (water, gatorade/powerade/pedialyte, juices, or teas) to keep your throat moisturized and help further relieve irritation/discomfort.    Return or go to the Emergency Department if symptoms worsen or do not improve in the next few days

## 2022-04-18 NOTE — ED Provider Notes (Signed)
[facility]    CSN: [id number] Arrival date & time: 04/18/22  8264      History   Chief Complaint Chief Complaint  Patient presents with   Fever   Cough    HPI Jaylnn MARYGRACE SANDOVAL is a 41 y.o. female.   HPI   Neka presents for fever and cough that started on Tuesday. Has chest congestion, loose stools, body aches.  States everyone at the job has been sick.  One of her co-workers kids has the flu. No vomiting. She has been unable to lay down without coughing. The cough is waking her up at night.  Her temperatures have been 98.4-99.8 F.  States her temperatures run a little low.    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   HLD (hyperlipidemia)    Infertility, female    Menorrhagia    Vitamin D deficiency     Patient Active Problem List   Diagnosis Date Noted   Annual physical exam 08/02/2019   Tobacco abuse 05/03/2019   Anxiety and depression 05/03/2019   Thrombocytosis 05/03/2019   HLD (hyperlipidemia)    Menorrhagia with irregular cycle 12/07/2018   Dysmenorrhea 01/25/2018   Endometriosis 01/25/2018    Past Surgical History:  Procedure Laterality Date   CYSTECTOMY     @ age 19yr, rt ovary    LEEP N/A 08/26/2021   Procedure: LOOP ELECTROSURGICAL EXCISION PROCEDURE (LEEP);  Surgeon: CRubie Maid MD;  Location: [facility] ORS;  Service: Gynecology;  Laterality: N/A;    OB History     Gravida  0   Para  0   Term  0   Preterm  0   AB  0   Living  0      SAB  0   IAB  0   Ectopic  0   Multiple  0   Live Births               Home Medications    Prior to Admission medications   Medication Sig Start Date End Date Taking? Authorizing Provider  benzonatate (TESSALON) 100 MG capsule Take 2 capsules (200 mg total) by mouth 3 (three) times daily as needed for cough. 04/18/22  Yes  Juleon Narang, DO  norethindrone (AYGESTIN) 5 MG tablet Take 2 tablets (10 mg total) by mouth daily. 04/16/21  Yes CRubie Maid MD  promethazine-dextromethorphan (PROMETHAZINE-DM) 6.25-15 MG/5ML syrup Take 5 mLs by mouth 4 (four) times daily as needed. 04/18/22  Yes Dorella Laster, DO  ibuprofen (ADVIL) 600 MG tablet Take 1 tablet (600 mg total) by mouth every 6 (six) hours as needed. 08/26/21   CRubie Maid MD  Omega-3 Fatty Acids (FISH OIL) 1000 MG CAPS Take 1,000 mg by mouth daily.    [provider]    Family History Family History  Problem Relation Age of Onset   Obesity Mother        ? cause of death sepsis    Depression Mother    Mental illness Mother    Diabetes Mother    Hypertension Mother    Miscarriages / Stillbirths Maternal Grandmother    Skin cancer Other    Breast cancer Neg Hx     Social History Social History   Tobacco Use   Smoking status: Every Day  Packs/day: 0.50    Years: 25.00    Total pack years: 12.50    Types: Cigarettes   Smokeless tobacco: Never  Vaping Use   Vaping Use: Never used  Substance Use Topics   Alcohol use: Not Currently   Drug use: Not Currently    Comment: history of substance abuse (Percocet)     Allergies   Patient has no known allergies.   Review of Systems Review of Systems: negative unless otherwise stated in HPI.      Physical Exam Triage Vital Signs ED Triage Vitals  Enc Vitals Group     BP 04/18/22 1107 (!) 140/86     Pulse Rate 04/18/22 1107 82     Resp --      Temp 04/18/22 1107 98.3 F (36.8 C)     Temp Source 04/18/22 1107 Oral     SpO2 04/18/22 1107 98 %     Weight 04/18/22 1105 120 lb (54.4 kg)     Height 04/18/22 1105 5' (1.524 m)     Head Circumference --      Peak Flow --      Pain Score 04/18/22 1105 0     Pain Loc --      Pain Edu? --      Excl. in [place]? --    No data found.  Updated Vital Signs BP (!) 140/86 (BP Location: Left Arm)   Pulse 82   Temp 98.3 F (36.8 C)  (Oral)   Ht 5' (1.524 m)   Wt 54.4 kg   SpO2 98%   BMI 23.44 kg/m   Visual Acuity Right Eye Distance:   Left Eye Distance:   Bilateral Distance:    Right Eye Near:   Left Eye Near:    Bilateral Near:     Physical Exam GEN:     alert, non-toxic appearing female in no distress    HENT:  mucus membranes moist, oropharyngeal without lesions or exudate, no tonsillar hypertrophy, mild oropharyngeal erythema, clear nasal discharge, bilateral TM normal EYES:   pupils equal and reactive, no scleral injection or discharge NECK:  normal ROM, no meningismus   RESP:  no increased work of breathing, clear to auscultation bilaterally CVS:   regular rate and rhythm Skin:   warm and dry    UC Treatments / Results  Labs (all labs ordered are listed, but only abnormal results are displayed) Labs Reviewed  SARS CORONAVIRUS 2 BY RT PCR    EKG   Radiology No results found.  Procedures Procedures (including critical care time)  Medications Ordered in UC Medications - No data to display  Initial Impression / Assessment and Plan / UC Course  I have reviewed the triage vital signs and the nursing notes.  Pertinent labs & imaging results that were available during my care of the patient were reviewed by me and considered in my medical decision making (see chart for details).       Pt is a 41 y.o. female who presents for 3 days of respiratory symptoms. Kaydince is afebrile here without recent antipyretics. Satting well on room air. Overall pt is non-toxic appearing, well hydrated, without respiratory distress. Pulmonary exam is unremarkable.  COVID and was negative. History consistent with viral respiratory illness. I suspect she has the flu but is outside the treatment window.  No influenza testing available.  Discussed symptomatic treatment.  Explained lack of efficacy of antibiotics in viral disease.  Typical duration of symptoms discussed.   Return  and ED precautions given and voiced  understanding. Discussed MDM, treatment plan and plan for follow-up with patient who agrees with plan.     Final Clinical Impressions(s) / UC Diagnoses   Final diagnoses:  Influenza-like illness     Discharge Instructions      Your COVID test is negative.  I suspect you have the flu. Stop by the pharmacy to pick up your medications. You can take Tylenol and/or Ibuprofen as needed for fever reduction and pain relief.    For cough: honey 1/2 to 1 teaspoon (you can dilute the honey in water or another fluid).  You can also use guaifenesin and dextromethorphan for cough. You can use a humidifier for chest congestion and cough.  If you don't have a humidifier, you can sit in the bathroom with the hot shower running.      For sore throat: try warm salt water gargles, Mucinex sore throat cough drops or cepacol lozenges, throat spray, warm tea or water with lemon/honey, popsicles or ice, or OTC cold relief medicine for throat discomfort. You can also purchase chloraseptic spray at the pharmacy or dollar store.   For congestion: take a daily anti-histamine like Zyrtec, Claritin, and a oral decongestant, such as pseudoephedrine.  You can also use Flonase 1-2 sprays in each nostril daily. Afrin is also a good option, if you do not have high blood pressure.    It is important to stay hydrated: drink plenty of fluids (water, gatorade/powerade/pedialyte, juices, or teas) to keep your throat moisturized and help further relieve irritation/discomfort.    Return or go to the Emergency Department if symptoms worsen or do not improve in the next few days      ED Prescriptions     Medication Sig Dispense Auth. Provider   promethazine-dextromethorphan (PROMETHAZINE-DM) 6.25-15 MG/5ML syrup Take 5 mLs by mouth 4 (four) times daily as needed. 118 mL Sofie Schendel, DO   benzonatate (TESSALON) 100 MG capsule Take 2 capsules (200 mg total) by mouth 3 (three) times daily as needed for cough. 21 capsule  Lyndee Hensen, DO      PDMP not reviewed this encounter.   Lyndee Hensen, DO 04/18/22 1243

## 2022-04-18 NOTE — Telephone Encounter (Signed)
    Chief Complaint: Cough, congestion, fever, wheezing Symptoms: Above Frequency: 3 days ago Pertinent Negatives: Patient denies  Disposition: '[]'$ ED /'[x]'$ Urgent Care (no appt availability in office) / '[]'$ Appointment(In office/virtual)/ '[]'$  [facility]/ '[]'$ Home Care/ '[]'$ Refused Recommended Disposition /'[]'$ [facility]/ '[]'$  Follow-up with PCP Additional Notes: No PCP per pt.  Reason for Disposition  [1] MILD difficulty breathing (e.g., minimal/no SOB at rest, SOB with walking, pulse <100) AND [2] still present when not coughing  Answer Assessment - Initial Assessment Questions 1. ONSET: "When did the cough begin?"      3 days ago 2. SEVERITY: "How bad is the cough today?"      Severe 3. SPUTUM: "Describe the color of your sputum" (none, dry cough; clear, white, yellow, green)     None 4. HEMOPTYSIS: "Are you coughing up any blood?" If so ask: "How much?" (flecks, streaks, tablespoons, etc.)     No 5. DIFFICULTY BREATHING: "Are you having difficulty breathing?" If Yes, ask: "How bad is it?" (e.g., mild, moderate, severe)    - MILD: No SOB at rest, mild SOB with walking, speaks normally in sentences, can lie down, no retractions, pulse < 100.    - MODERATE: SOB at rest, SOB with minimal exertion and prefers to sit, cannot lie down flat, speaks in phrases, mild retractions, audible wheezing, pulse 100-120.    - SEVERE: Very SOB at rest, speaks in single words, struggling to breathe, sitting hunched forward, retractions, pulse > 120      Mild 6. FEVER: "Do you have a fever?" If Yes, ask: "What is your temperature, how was it measured, and when did it start?"     Yes 7. CARDIAC HISTORY: "Do you have any history of heart disease?" (e.g., heart attack, congestive heart failure)      No 8. LUNG HISTORY: "Do you have any history of lung disease?"  (e.g., pulmonary embolus, asthma, emphysema)     No 9. PE RISK FACTORS: "Do you have a history of blood clots?" (or: recent major  surgery, recent prolonged travel, bedridden)     No 10. OTHER SYMPTOMS: "Do you have any other symptoms?" (e.g., runny nose, wheezing, chest pain)       Wheezing 11. PREGNANCY: "Is there any chance you are pregnant?" "When was your last menstrual period?"       No 12. TRAVEL: "Have you traveled out of the country in the last month?" (e.g., travel history, exposures)       no  Protocols used: Cough - Acute Non-Productive-A-AH

## 2022-05-07 ENCOUNTER — Other Ambulatory Visit: Payer: Self-pay

## 2022-05-24 IMAGING — MG MM DIGITAL DIAGNOSTIC UNILAT*R* W/ TOMO W/ CAD
8 series · 8 of 24 positions shown · non-contrast
Comparison: Previous exam(s).

CLINICAL DATA: Screening recall for 2 right breast asymmetries.

EXAM:
DIGITAL DIAGNOSTIC UNILATERAL RIGHT MAMMOGRAM WITH TOMOSYNTHESIS AND
CAD; ULTRASOUND RIGHT BREAST LIMITED
TECHNIQUE: Right digital diagnostic mammography and breast tomosynthesis was
performed. The images were evaluated with computer-aided detection.;
Targeted ultrasound examination of the right breast was performed

[R ML synth-2D (1 of 2)]
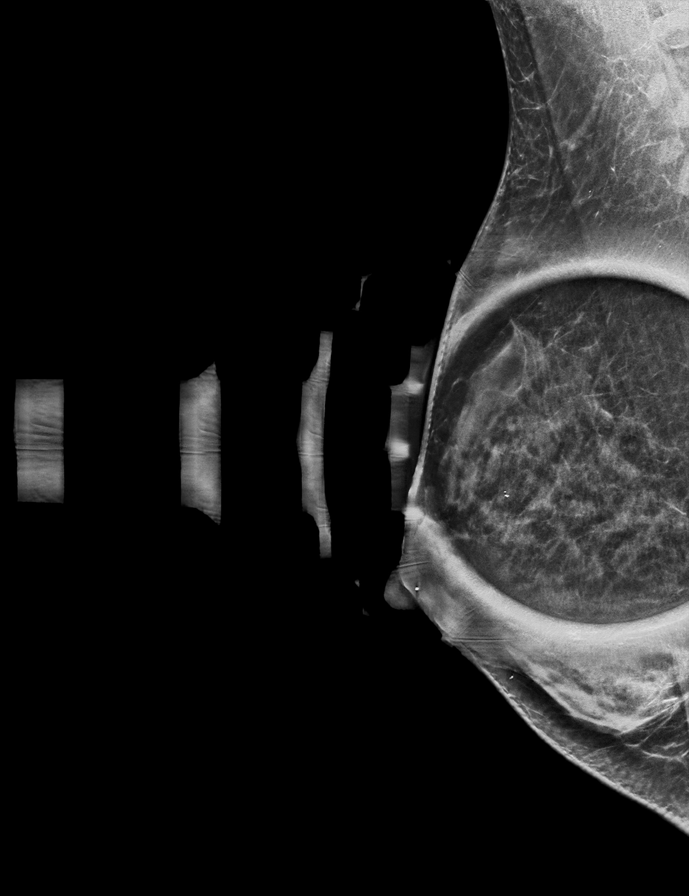

[R CC synth-2D (1 of 2)]
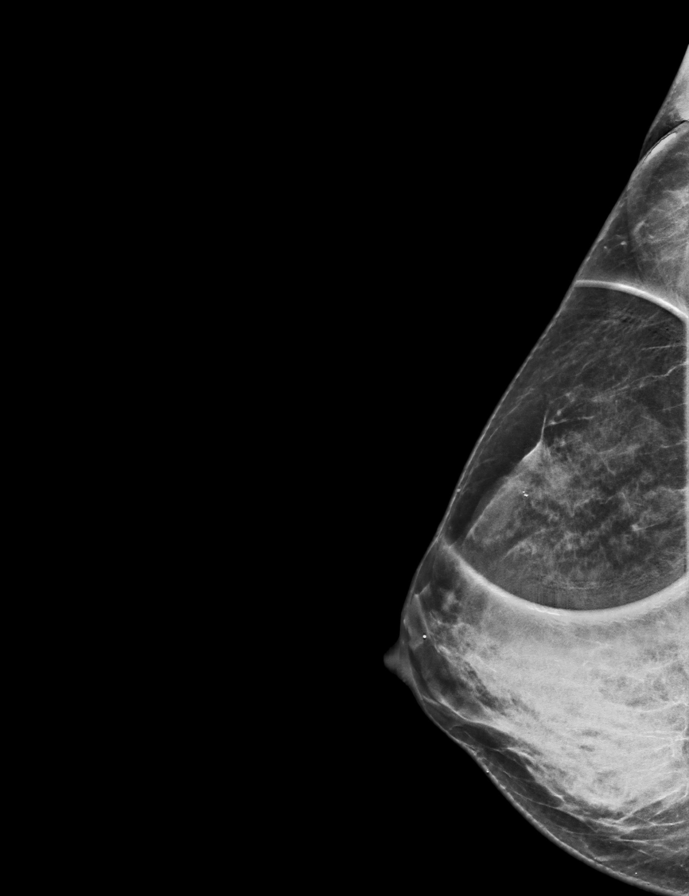

[R ML synth-2D (2 of 2)]
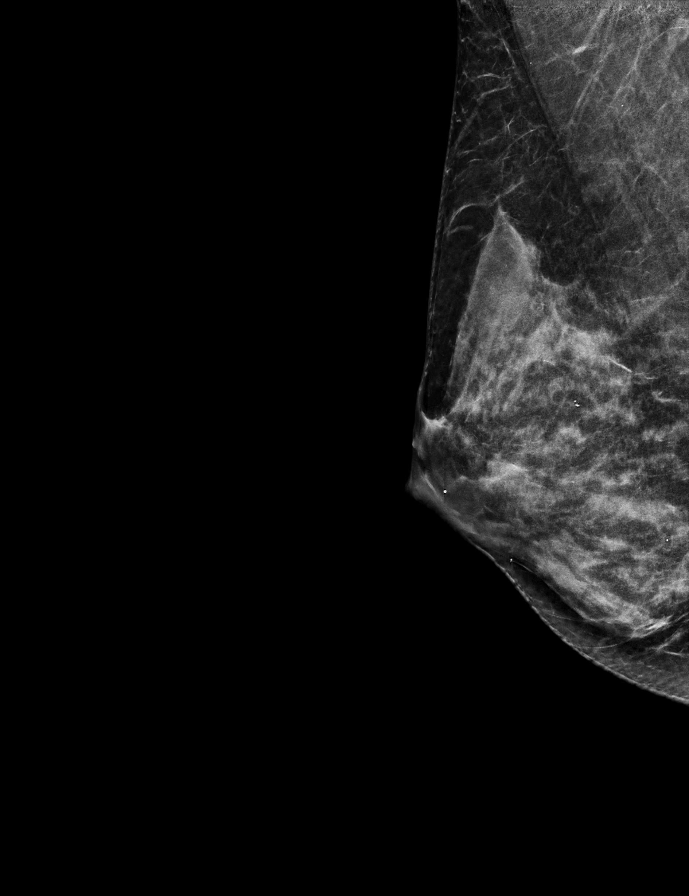

[R CC synth-2D (2 of 2)]
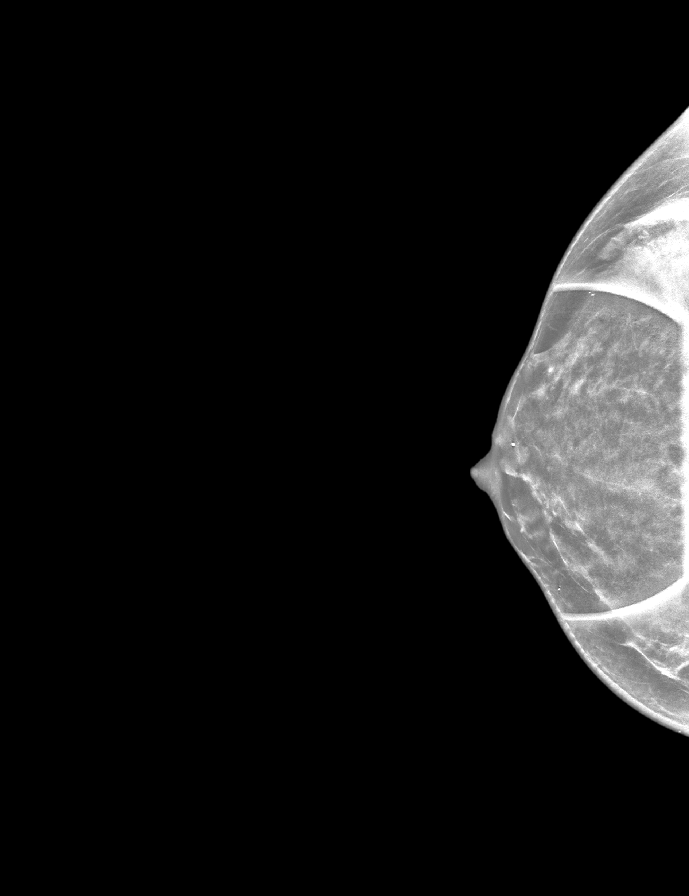

[R CC tomo (1 of 2) · tomo slice 25/49.0]
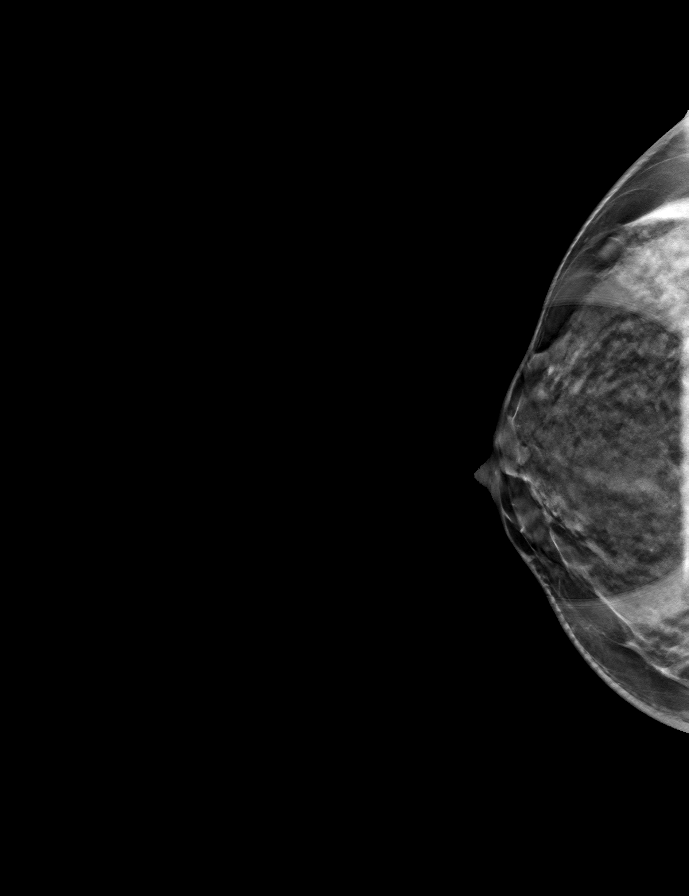

[R ML tomo (1 of 2) · tomo slice 27/53.0]
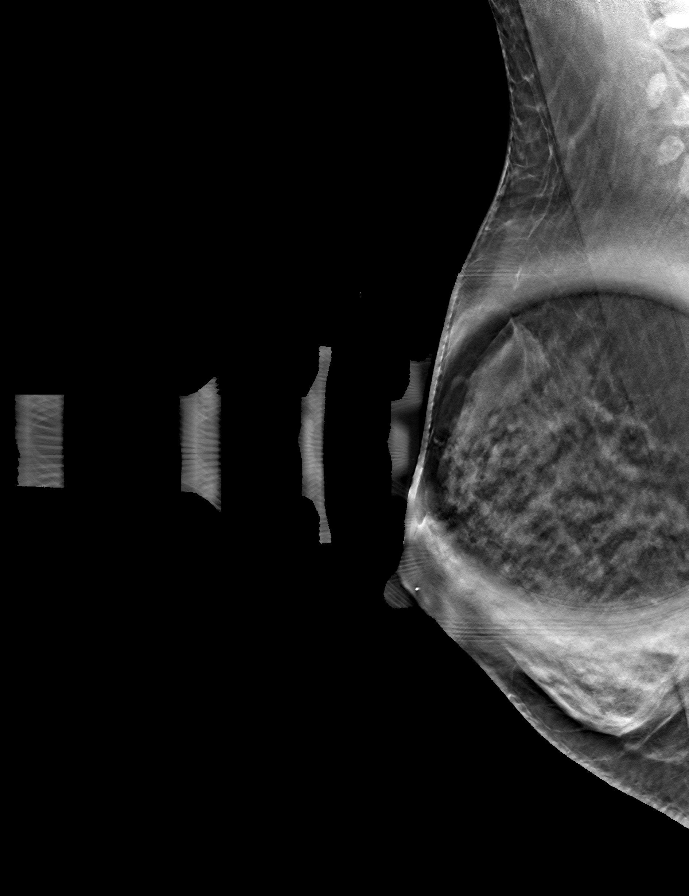

[R ML tomo (2 of 2) · tomo slice 26/51.0]
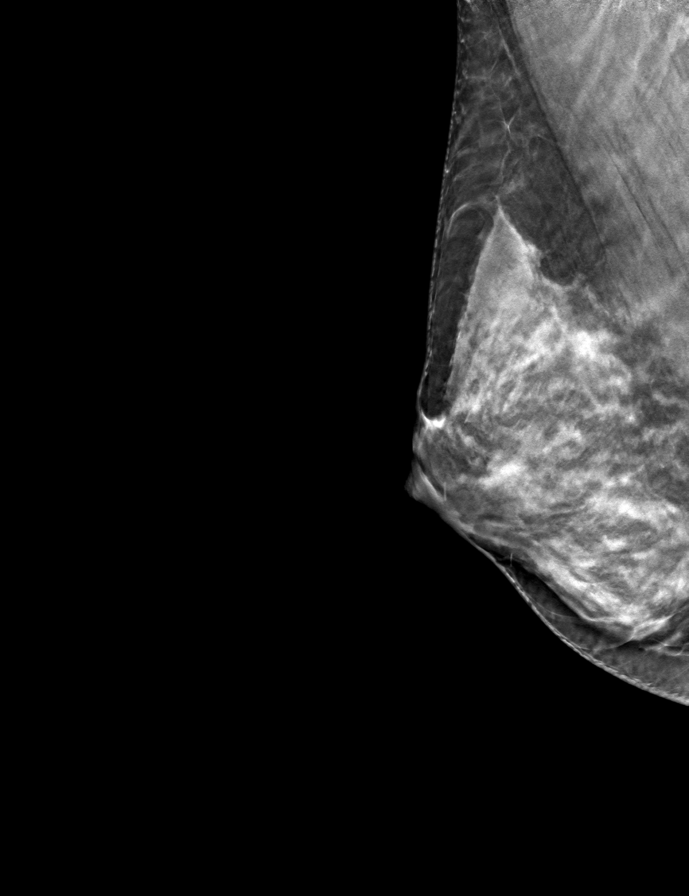

[R CC tomo (2 of 2) · tomo slice 25/49.0]
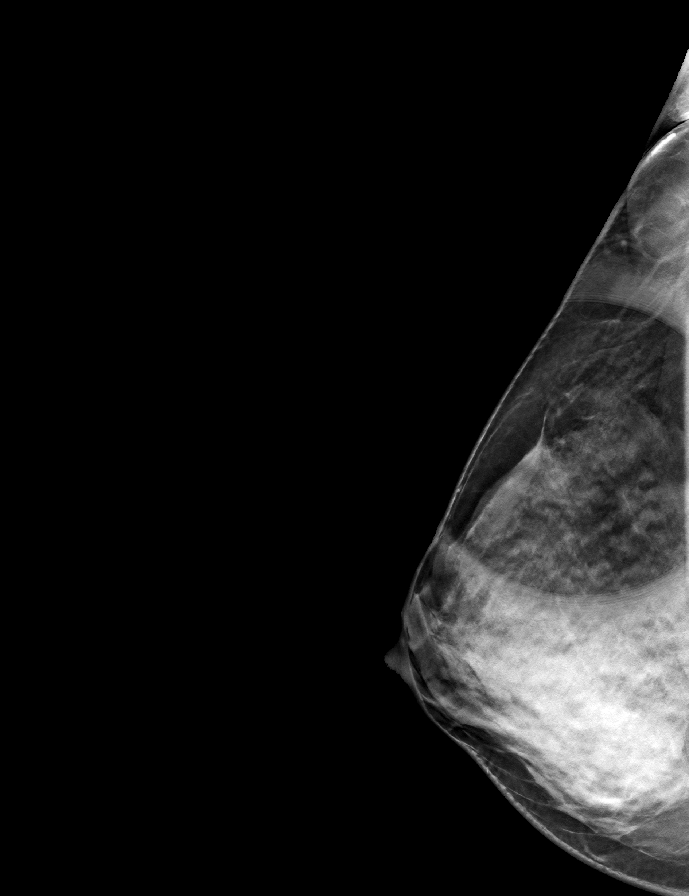

[8 of 24 positions shown; findings below may reference images not displayed]

ACR Breast Density Category c: The breast tissue is heterogeneously
dense, which may obscure small masses.
FINDINGS: Spot compression tomosynthesis images of the retroareolar right
breast demonstrates a lobulated mass in the lateral retroareolar
breast measuring approximately 1.2 cm. There is a focal asymmetry in
the upper outer far posterior right breast measuring approximately 1
cm. It appears to disperse on spot compression tomosynthesis images
in the lateral projection.

Ultrasound of the right breast at 9 o'clock, 6 cm from the nipple
demonstrates an oval hypoechoic mass measuring 0.6 x 0.2 x 0.6 cm.
This may represent fibrocystic changes.

Ultrasound of the right breast at 9 o'clock, 7 cm from the nipple
demonstrates an oval hypoechoic mass measuring 0.7 x 0.2 x 0.6 cm.
There is blood flow peripheral to this mass suggesting that this may
represent a lymph node.

Ultrasound of the retroareolar right breast at 8 o'clock
demonstrates an anechoic oval circumscribed mass measuring 1.1 x
x 1.0 cm. This is compatible with a benign cyst.
IMPRESSION: 1. There are 2 asymmetries in the right breast which are likely
benign. The asymmetry in the retroareolar right breast does not have
a sonographic correlate. The asymmetry in the lateral posterior
right breast may have a sonographic correlate at 9 o'clock as there
are 2 likely benign masses in the right breast at 9 o'clock, 6 and 7
cm from the nipple.

RECOMMENDATION:
Six-month follow-up right breast diagnostic mammogram and
ultrasound.

I have discussed the findings and recommendations with the patient.
If applicable, a reminder letter will be sent to the patient
regarding the next appointment.

BI-RADS CATEGORY  3: Probably benign.

## 2022-07-01 ENCOUNTER — Encounter: Payer: 59 | Admitting: Obstetrics and Gynecology

## 2022-07-13 ENCOUNTER — Other Ambulatory Visit: Payer: Self-pay | Admitting: Obstetrics and Gynecology

## 2022-07-29 ENCOUNTER — Other Ambulatory Visit: Payer: Self-pay | Admitting: Obstetrics and Gynecology

## 2022-07-29 DIAGNOSIS — R928 Other abnormal and inconclusive findings on diagnostic imaging of breast: Secondary | ICD-10-CM

## 2022-07-29 DIAGNOSIS — N63 Unspecified lump in unspecified breast: Secondary | ICD-10-CM

## 2022-08-18 ENCOUNTER — Ambulatory Visit
Admission: RE | Admit: 2022-08-18 | Discharge: 2022-08-18 | Disposition: A | Discharge status: Home / Self Care | Payer: 59 | Source: Ambulatory Visit | Attending: Obstetrics and Gynecology | Admitting: Obstetrics and Gynecology

## 2022-08-18 ENCOUNTER — Encounter: Payer: Self-pay | Admitting: Radiology

## 2022-08-18 DIAGNOSIS — R928 Other abnormal and inconclusive findings on diagnostic imaging of breast: Secondary | ICD-10-CM | POA: Insufficient documentation

## 2022-08-18 DIAGNOSIS — N63 Unspecified lump in unspecified breast: Secondary | ICD-10-CM

## 2022-09-22 NOTE — Progress Notes (Unsigned)
GYNECOLOGY ANNUAL PHYSICAL EXAM PROGRESS NOTE  Subjective:    Anita Hobbs is a 42 y.o. G0P0000 female who presents for an annual exam. The patient has no complaints today. The patient {is/is not/has never been:13135} sexually active. The patient participates in regular exercise: {yes/no/not asked:9010}. Has the patient ever been transfused or tattooed?: {yes/no/not asked:9010}. The patient reports that there {is/is not:9024} domestic violence in her life.    Menstrual History: Menarche age: 89 No LMP recorded. (Menstrual status: Oral contraceptives).     Gynecologic History:  Contraception: none History of STI's: Denies Last Pap: 06/27/2021. Results were: abnormal. Notes h/o abnormal pap smears. Last mammogram: 08/18/2022. Results were: abnormal       OB History  Gravida Para Term Preterm AB Living  0 0 0 0 0 0  SAB IAB Ectopic Multiple Live Births  0 0 0 0 0    Past Medical History:  Diagnosis Date   Anxiety    Depression    Dysmenorrhea    Endometriosis    Heart murmur    pinhole size murmur   History of substance abuse ([facility])    Percocet   HLD (hyperlipidemia)    Infertility, female    Menorrhagia    Vitamin D deficiency     Past Surgical History:  Procedure Laterality Date   CYSTECTOMY     @ age 24yrs, rt ovary    LEEP N/A 08/26/2021   Procedure: LOOP ELECTROSURGICAL EXCISION PROCEDURE (LEEP);  Surgeon: Hildred Laser, MD;  Location: [facility] ORS;  Service: Gynecology;  Laterality: N/A;    Family History  Problem Relation Age of Onset   Obesity Mother        ? cause of death sepsis    Depression Mother    Mental illness Mother    Diabetes Mother    Hypertension Mother    Miscarriages / Stillbirths Maternal Grandmother    Breast cancer Other    Skin cancer Other     Social History   Socioeconomic History   Marital status: Married    Spouse name: Edelia Bernett   Number of children: Not on file   Years of education: Not on file    Highest education level: Not on file  Occupational History   Not on file  Tobacco Use   Smoking status: Every Day    Packs/day: 0.50    Years: 25.00    Additional pack years: 0.00    Total pack years: 12.50    Types: Cigarettes   Smokeless tobacco: Never  Vaping Use   Vaping Use: Never used  Substance and Sexual Activity   Alcohol use: Not Currently   Drug use: Not Currently    Comment: history of substance abuse (Percocet)   Sexual activity: Yes    Birth control/protection: None, Pill  Other Topics Concern   Not on file  Social History Narrative   Dogs    Smoker    DPR Lai [place]esee [phone]   Divorced    Smoker since age 34 y.o   Social Determinants of Health   Financial Resource Strain: Not on file  Food Insecurity: Not on file  Transportation Needs: Not on file  Physical Activity: Not on file  Stress: Not on file  Social Connections: Not on file  Intimate Partner Violence: Not on file    Current Outpatient Medications on File Prior to Visit  Medication Sig Dispense Refill   benzonatate (TESSALON) 100 MG capsule Take 2 capsules (200 mg  total) by mouth 3 (three) times daily as needed for cough. 21 capsule 0   ibuprofen (ADVIL) 600 MG tablet Take 1 tablet (600 mg total) by mouth every 6 (six) hours as needed. 60 tablet 0   norethindrone (AYGESTIN) 5 MG tablet TAKE 2 TABLETS BY MOUTH DAILY 180 tablet 0   Omega-3 Fatty Acids (FISH OIL) 1000 MG CAPS Take 1,000 mg by mouth daily.     promethazine-dextromethorphan (PROMETHAZINE-DM) 6.25-15 MG/5ML syrup Take 5 mLs by mouth 4 (four) times daily as needed. 118 mL 0   No current facility-administered medications on file prior to visit.    No Known Allergies   Review of Systems Constitutional: negative for chills, fatigue, fevers and sweats Eyes: negative for irritation, redness and visual disturbance Ears, nose, mouth, throat, and face: negative for hearing loss, nasal congestion, snoring and  tinnitus Respiratory: negative for asthma, cough, sputum Cardiovascular: negative for chest pain, dyspnea, exertional chest pressure/discomfort, irregular heart beat, palpitations and syncope Gastrointestinal: negative for abdominal pain, change in bowel habits, nausea and vomiting Genitourinary: negative for abnormal menstrual periods, genital lesions, sexual problems and vaginal discharge, dysuria and urinary incontinence Integument/breast: negative for breast lump, breast tenderness and nipple discharge Hematologic/lymphatic: negative for bleeding and easy bruising Musculoskeletal:negative for back pain and muscle weakness Neurological: negative for dizziness, headaches, vertigo and weakness Endocrine: negative for diabetic symptoms including polydipsia, polyuria and skin dryness Allergic/Immunologic: negative for hay fever and urticaria      Objective:  There were no vitals taken for this visit. There is no height or weight on file to calculate BMI.    General Appearance:    Alert, cooperative, no distress, appears stated age  Head:    Normocephalic, without obvious abnormality, atraumatic  Eyes:    PERRL, conjunctiva/corneas clear, EOM's intact, both eyes  Ears:    Normal external ear canals, both ears  Nose:   Nares normal, septum midline, mucosa normal, no drainage or sinus tenderness  Throat:   Lips, mucosa, and tongue normal; teeth and gums normal  Neck:   Supple, symmetrical, trachea midline, no adenopathy; thyroid: no enlargement/tenderness/nodules; no carotid bruit or JVD  Back:     Symmetric, no curvature, ROM normal, no CVA tenderness  Lungs:     Clear to auscultation bilaterally, respirations unlabored  Chest Wall:    No tenderness or deformity   Heart:    Regular rate and rhythm, S1 and S2 normal, no murmur, rub or gallop  Breast Exam:    No tenderness, masses, or nipple abnormality  Abdomen:     Soft, non-tender, bowel sounds active all four quadrants, no masses, no  organomegaly.    Genitalia:    Pelvic:external genitalia normal, vagina without lesions, discharge, or tenderness, rectovaginal septum  normal. Cervix normal in appearance, no cervical motion tenderness, no adnexal masses or tenderness.  Uterus normal size, shape, mobile, regular contours, nontender.  Rectal:    Normal external sphincter.  No hemorrhoids appreciated. Internal exam not done.   Extremities:   Extremities normal, atraumatic, no cyanosis or edema  Pulses:   2+ and symmetric all extremities  Skin:   Skin color, texture, turgor normal, no rashes or lesions  Lymph nodes:   Cervical, supraclavicular, and axillary nodes normal  Neurologic:   CNII-XII intact, normal strength, sensation and reflexes throughout   .  Labs:  Lab Results  Component Value Date   WBC 8.4 06/27/2021   HGB 16.1 (H) 06/27/2021   HCT 47.8 (H) 06/27/2021   MCV  96 06/27/2021   PLT 304 06/27/2021    Lab Results  Component Value Date   CREATININE 0.75 06/27/2021   BUN 9 06/27/2021   NA 139 06/27/2021   K 3.8 06/27/2021   CL 102 06/27/2021   CO2 24 06/27/2021    Lab Results  Component Value Date   ALT 40 (H) 06/27/2021   AST 24 06/27/2021   ALKPHOS 58 06/27/2021   BILITOT <0.2 06/27/2021    Lab Results  Component Value Date   TSH 0.863 06/27/2021     Assessment:   1. Encounter for well woman exam with routine gynecological exam      Plan:  Blood tests: Ordered. Breast self exam technique reviewed and patient encouraged to perform self-exam monthly. Contraception: none. Discussed healthy lifestyle modifications. Mammogram  UTD Pap smear  UTD . COVID vaccination status: Follow up in 1 year for annual exam   Hildred Laser, MD [facility]

## 2022-09-22 NOTE — Patient Instructions (Signed)
Preventive Care 42-42 Years Old, Female Preventive care refers to lifestyle choices and visits with your health care provider that can promote health and wellness. Preventive care visits are also called wellness exams. What can I expect for my preventive care visit? Counseling Your health care provider may ask you questions about your: Medical history, including: Past medical problems. Family medical history. Pregnancy history. Current health, including: Menstrual cycle. Method of birth control. Emotional well-being. Home life and relationship well-being. Sexual activity and sexual health. Lifestyle, including: Alcohol, nicotine or tobacco, and drug use. Access to firearms. Diet, exercise, and sleep habits. Work and work environment. Sunscreen use. Safety issues such as seatbelt and bike helmet use. Physical exam Your health care provider will check your: Height and weight. These may be used to calculate your BMI (body mass index). BMI is a measurement that tells if you are at a healthy weight. Waist circumference. This measures the distance around your waistline. This measurement also tells if you are at a healthy weight and may help predict your risk of certain diseases, such as type 2 diabetes and high blood pressure. Heart rate and blood pressure. Body temperature. Skin for abnormal spots. What immunizations do I need?  Vaccines are usually given at various ages, according to a schedule. Your health care provider will recommend vaccines for you based on your age, medical history, and lifestyle or other factors, such as travel or where you work. What tests do I need? Screening Your health care provider may recommend screening tests for certain conditions. This may include: Lipid and cholesterol levels. Diabetes screening. This is done by checking your blood sugar (glucose) after you have not eaten for a while (fasting). Pelvic exam and Pap test. Hepatitis B test. Hepatitis C  test. HIV (human immunodeficiency virus) test. STI (sexually transmitted infection) testing, if you are at risk. Lung cancer screening. Colorectal cancer screening. Mammogram. Talk with your health care provider about when you should start having regular mammograms. This may depend on whether you have a family history of breast cancer. BRCA-related cancer screening. This may be done if you have a family history of breast, ovarian, tubal, or peritoneal cancers. Bone density scan. This is done to screen for osteoporosis. Talk with your health care provider about your test results, treatment options, and if necessary, the need for more tests. Follow these instructions at home: Eating and drinking  Eat a diet that includes fresh fruits and vegetables, whole grains, lean protein, and low-fat dairy products. Take vitamin and mineral supplements as recommended by your health care provider. Do not drink alcohol if: Your health care provider tells you not to drink. You are pregnant, may be pregnant, or are planning to become pregnant. If you drink alcohol: Limit how much you have to 0-1 drink a day. Know how much alcohol is in your drink. In the [place] one drink equals one 12 oz bottle of beer (355 mL), one 5 oz glass of wine (148 mL), or one 1 oz glass of hard liquor (44 mL). Lifestyle Brush your teeth every morning and night with fluoride toothpaste. Floss one time each day. Exercise for at least 30 minutes 5 or more days each week. Do not use any products that contain nicotine or tobacco. These products include cigarettes, chewing tobacco, and vaping devices, such as e-cigarettes. If you need help quitting, ask your health care provider. Do not use drugs. If you are sexually active, practice safe sex. Use a condom or other form of protection to   prevent STIs. If you do not wish to become pregnant, use a form of birth control. If you plan to become pregnant, see your health care provider for a  prepregnancy visit. Take aspirin only as told by your health care provider. Make sure that you understand how much to take and what form to take. Work with your health care provider to find out whether it is safe and beneficial for you to take aspirin daily. Find healthy ways to manage stress, such as: Meditation, yoga, or listening to music. Journaling. Talking to a trusted person. Spending time with friends and family. Minimize exposure to UV radiation to reduce your risk of skin cancer. Safety Always wear your seat belt while driving or riding in a vehicle. Do not drive: If you have been drinking alcohol. Do not ride with someone who has been drinking. When you are tired or distracted. While texting. If you have been using any mind-altering substances or drugs. Wear a helmet and other protective equipment during sports activities. If you have firearms in your house, make sure you follow all gun safety procedures. Seek help if you have been physically or sexually abused. What's next? Visit your health care provider once a year for an annual wellness visit. Ask your health care provider how often you should have your eyes and teeth checked. Stay up to date on all vaccines. This information is not intended to replace advice given to you by your health care provider. Make sure you discuss any questions you have with your health care provider. Document Revised: 10/03/2020 Document Reviewed: 10/03/2020 Elsevier Patient Education  2024 Elsevier Inc. Breast Self-Awareness Breast self-awareness is knowing how your breasts look and feel. You need to: Check your breasts on a regular basis. Tell your doctor about any changes. Become familiar with the look and feel of your breasts. This can help you catch a breast problem while it is still small and can be treated. You should do breast self-exams even if you have breast implants. What you need: A mirror. A well-lit room. A pillow or other  soft object. How to do a breast self-exam Follow these steps to do a breast self-exam: Look for changes  Take off all the clothes above your waist. Stand in front of a mirror in a room with good lighting. Put your hands down at your sides. Compare your breasts in the mirror. Look for any difference between them, such as: A difference in shape. A difference in size. Wrinkles, dips, and bumps in one breast and not the other. Look at each breast for changes in the skin, such as: Redness. Scaly areas. Skin that has gotten thicker. Dimpling. Open sores (ulcers). Look for changes in your nipples, such as: Fluid coming out of a nipple. Fluid around a nipple. Bleeding. Dimpling. Redness. A nipple that looks pushed in (retracted), or that has changed position. Feel for changes Lie on your back. Feel each breast. To do this: Pick a breast to feel. Place a pillow under the shoulder closest to that breast. Put the arm closest to that breast behind your head. Feel the nipple area of that breast using the hand of your other arm. Feel the area with the pads of your three middle fingers by making small circles with your fingers. Use light, medium, and firm pressure. Continue the overlapping circles, moving downward over the breast. Keep making circles with your fingers. Stop when you feel your ribs. Start making circles with your fingers again, this time going   upward until you reach your collarbone. Then, make circles outward across your breast and into your armpit area. Squeeze your nipple. Check for discharge and lumps. Repeat these steps to check your other breast. Sit or stand in the tub or shower. With soapy water on your skin, feel each breast the same way you did when you were lying down. Write down what you find Writing down what you find can help you remember what to tell your doctor. Write down: What is normal for each breast. Any changes you find in each breast. These  include: The kind of changes you find. A tender or painful breast. Any lump you find. Write down its size and where it is. When you last had your monthly period (menstrual cycle). General tips If you are breastfeeding, the best time to check your breasts is after you feed your baby or after you use a breast pump. If you get monthly bleeding, the best time to check your breasts is 5-7 days after your monthly cycle ends. With time, you will become comfortable with the self-exam. You will also start to know if there are changes in your breasts. Contact a doctor if: You see a change in the shape or size of your breasts or nipples. You see a change in the skin of your breast or nipples, such as red or scaly skin. You have fluid coming from your nipples that is not normal. You find a new lump or thick area. You have breast pain. You have any concerns about your breast health. Summary Breast self-awareness includes looking for changes in your breasts and feeling for changes within your breasts. You should do breast self-awareness in front of a mirror in a well-lit room. If you get monthly periods (menstrual cycles), the best time to check your breasts is 5-7 days after your period ends. Tell your doctor about any changes you see in your breasts. Changes include changes in size, changes on the skin, painful or tender breasts, or fluid from your nipples that is not normal. This information is not intended to replace advice given to you by your health care provider. Make sure you discuss any questions you have with your health care provider. Document Revised: 09/12/2021 Document Reviewed: 02/07/2021 Elsevier Patient Education  2024 Elsevier Inc.  

## 2022-09-24 ENCOUNTER — Ambulatory Visit (INDEPENDENT_AMBULATORY_CARE_PROVIDER_SITE_OTHER): Payer: 59 | Admitting: Obstetrics and Gynecology

## 2022-09-24 ENCOUNTER — Other Ambulatory Visit (HOSPITAL_COMMUNITY)
Admission: RE | Admit: 2022-09-24 | Discharge: 2022-09-24 | Disposition: A | Discharge status: Home / Self Care | Payer: 59 | Source: Ambulatory Visit | Attending: Obstetrics and Gynecology | Admitting: Obstetrics and Gynecology

## 2022-09-24 ENCOUNTER — Encounter: Payer: Self-pay | Admitting: Obstetrics and Gynecology

## 2022-09-24 VITALS — BP 119/77 | HR 84 | Resp 16 | Ht 60.0 in | Wt 120.7 lb

## 2022-09-24 DIAGNOSIS — Z01419 Encounter for gynecological examination (general) (routine) without abnormal findings: Secondary | ICD-10-CM

## 2022-09-24 DIAGNOSIS — Z1159 Encounter for screening for other viral diseases: Secondary | ICD-10-CM

## 2022-09-24 DIAGNOSIS — Z131 Encounter for screening for diabetes mellitus: Secondary | ICD-10-CM

## 2022-09-24 DIAGNOSIS — Z124 Encounter for screening for malignant neoplasm of cervix: Secondary | ICD-10-CM | POA: Diagnosis present

## 2022-09-24 DIAGNOSIS — N393 Stress incontinence (female) (male): Secondary | ICD-10-CM

## 2022-09-24 DIAGNOSIS — E785 Hyperlipidemia, unspecified: Secondary | ICD-10-CM

## 2022-09-24 DIAGNOSIS — R232 Flushing: Secondary | ICD-10-CM

## 2022-09-24 DIAGNOSIS — Z114 Encounter for screening for human immunodeficiency virus [HIV]: Secondary | ICD-10-CM

## 2022-09-25 LAB — COMPREHENSIVE METABOLIC PANEL
ALT: 31 IU/L (ref 0–32)
AST: 22 IU/L (ref 0–40)
Albumin/Globulin Ratio: 1.6 (ref 1.2–2.2)
Albumin: 4.1 g/dL (ref 3.9–4.9)
Alkaline Phosphatase: 69 IU/L (ref 44–121)
BUN/Creatinine Ratio: 10 (ref 9–23)
BUN: 9 mg/dL (ref 6–24)
Bilirubin Total: 0.4 mg/dL (ref 0.0–1.2)
CO2: 23 mmol/L (ref 20–29)
Calcium: 9.3 mg/dL (ref 8.7–10.2)
Chloride: 105 mmol/L (ref 96–106)
Creatinine, Ser: 0.9 mg/dL (ref 0.57–1.00)
Globulin, Total: 2.6 g/dL (ref 1.5–4.5)
Glucose: 84 mg/dL (ref 70–99)
Potassium: 4.6 mmol/L (ref 3.5–5.2)
Sodium: 140 mmol/L (ref 134–144)
Total Protein: 6.7 g/dL (ref 6.0–8.5)
eGFR: 82 mL/min/{1.73_m2} (ref >59)

## 2022-09-25 LAB — CBC
Hematocrit: 48.9 % — ABNORMAL HIGH (ref 34.0–46.6)
Hemoglobin: 16.8 g/dL — ABNORMAL HIGH (ref 11.1–15.9)
MCH: 33.1 pg — ABNORMAL HIGH (ref 26.6–33.0)
MCHC: 34.4 g/dL (ref 31.5–35.7)
MCV: 96 fL (ref 79–97)
Platelets: 300 10*3/uL (ref 150–450)
RBC: 5.08 x10E6/uL (ref 3.77–5.28)
RDW: 12.1 % (ref 11.7–15.4)
WBC: 9.3 10*3/uL (ref 3.4–10.8)

## 2022-09-25 LAB — LIPID PANEL
Chol/HDL Ratio: 9.5 ratio — ABNORMAL HIGH (ref 0.0–4.4)
Cholesterol, Total: 209 mg/dL — ABNORMAL HIGH (ref 100–199)
HDL: 22 mg/dL — ABNORMAL LOW (ref >39)
LDL Chol Calc (NIH): 174 mg/dL — ABNORMAL HIGH (ref 0–99)
Triglycerides: 73 mg/dL (ref 0–149)
VLDL Cholesterol Cal: 13 mg/dL (ref 5–40)

## 2022-09-25 LAB — HEMOGLOBIN A1C
Est. average glucose Bld gHb Est-mCnc: 111 mg/dL
Hgb A1c MFr Bld: 5.5 % (ref 4.8–5.6)

## 2022-10-01 ENCOUNTER — Encounter: Payer: Self-pay | Admitting: Obstetrics and Gynecology

## 2022-10-03 LAB — CYTOLOGY - PAP: Diagnosis: NEGATIVE

## 2022-10-06 ENCOUNTER — Other Ambulatory Visit: Payer: Self-pay | Admitting: Obstetrics and Gynecology

## 2022-10-06 DIAGNOSIS — Z0000 Encounter for general adult medical examination without abnormal findings: Secondary | ICD-10-CM

## 2022-10-06 DIAGNOSIS — E785 Hyperlipidemia, unspecified: Secondary | ICD-10-CM

## 2022-10-06 DIAGNOSIS — D751 Secondary polycythemia: Secondary | ICD-10-CM

## 2022-11-08 ENCOUNTER — Other Ambulatory Visit: Payer: Self-pay | Admitting: Obstetrics and Gynecology

## 2022-11-10 ENCOUNTER — Other Ambulatory Visit: Payer: Self-pay | Admitting: Obstetrics and Gynecology

## 2022-11-10 MED ORDER — NORETHINDRONE ACETATE 5 MG PO TABS: 10.0000 mg | ORAL_TABLET | Freq: Every day | ORAL | 0 refills | Status: DC

## 2023-01-04 ENCOUNTER — Other Ambulatory Visit: Payer: Self-pay | Admitting: Obstetrics and Gynecology

## 2023-05-06 ENCOUNTER — Telehealth: Payer: Self-pay

## 2023-05-06 DIAGNOSIS — Z3041 Encounter for surveillance of contraceptive pills: Secondary | ICD-10-CM

## 2023-05-06 NOTE — Telephone Encounter (Signed)
 Patient aware.

## 2023-05-06 NOTE — Telephone Encounter (Signed)
 Spoke w/[facility] who states rx was automatically transferred to [facility] due to the company insurance change.

## 2023-05-06 NOTE — Telephone Encounter (Signed)
 Patient states her insurance changed at the beginning of the year. [facility]. Requesting norethindrone  (AYGESTIN ) 5 MG tablet to [facility]. Advised refills still on file with [facility]. Will contact them to request refills.

## 2023-05-08 MED ORDER — NORETHINDRONE ACETATE 5 MG PO TABS: 10.0000 mg | ORAL_TABLET | Freq: Every day | ORAL | 2 refills | Status: DC

## 2023-05-08 NOTE — Addendum Note (Signed)
Addended by: Kathlene Cote on: 05/08/2023 11:55 AM   Modules accepted: Orders

## 2023-05-08 NOTE — Telephone Encounter (Signed)
Spoke with [facility] regarding rx request. Notified [facility] advised this was an automatic transfer. [facility] advised they have not received transfer of patient rx. Advised will send electronically.

## 2023-10-15 ENCOUNTER — Encounter: Payer: Self-pay | Admitting: Family Medicine

## 2023-10-15 ENCOUNTER — Ambulatory Visit: Admitting: Family Medicine

## 2023-10-15 VITALS — BP 120/84 | HR 77 | Ht 60.0 in | Wt 119.1 lb

## 2023-10-15 DIAGNOSIS — F321 Major depressive disorder, single episode, moderate: Secondary | ICD-10-CM | POA: Diagnosis not present

## 2023-10-15 DIAGNOSIS — Z7689 Persons encountering health services in other specified circumstances: Secondary | ICD-10-CM

## 2023-10-15 DIAGNOSIS — R030 Elevated blood-pressure reading, without diagnosis of hypertension: Secondary | ICD-10-CM | POA: Diagnosis not present

## 2023-10-15 DIAGNOSIS — E559 Vitamin D deficiency, unspecified: Secondary | ICD-10-CM | POA: Diagnosis not present

## 2023-10-15 DIAGNOSIS — E7800 Pure hypercholesterolemia, unspecified: Secondary | ICD-10-CM

## 2023-10-15 DIAGNOSIS — Z1231 Encounter for screening mammogram for malignant neoplasm of breast: Secondary | ICD-10-CM

## 2023-10-15 DIAGNOSIS — Z833 Family history of diabetes mellitus: Secondary | ICD-10-CM

## 2023-10-15 DIAGNOSIS — Z720 Tobacco use: Secondary | ICD-10-CM

## 2023-10-15 DIAGNOSIS — Z1159 Encounter for screening for other viral diseases: Secondary | ICD-10-CM

## 2023-10-15 DIAGNOSIS — Z130 Encounter for screening for diseases of the blood and blood-forming organs and certain disorders involving the immune mechanism: Secondary | ICD-10-CM

## 2023-10-15 DIAGNOSIS — D582 Other hemoglobinopathies: Secondary | ICD-10-CM

## 2023-10-15 NOTE — Progress Notes (Signed)
 New Patient Office Visit  Introduced to nurse practitioner role and practice setting.  All questions answered.  Discussed provider/patient relationship and expectations.  Subjective    Patient ID: Anita Hobbs, female    DOB: March 18, 1981  Age: 43 y.o. MRN: [id number]  CC:  Chief Complaint  Patient presents with   New Patient (Initial Visit)   Care Management    Mammogram - ok to order HIV screening - yes if covered Hepatitis C Screening - yes if covered Tdap - 5 years ago  Pneumococcal Vaccine - would like to wait HPV Vaccines - unsure Hepatitis B Vaccines - unsure   HPI  Discussed the use of AI scribe software for clinical note transcription with the patient, who gave verbal consent to proceed.  History of Present Illness Anita Hobbs is a 43 year old female who presents to establish care.  She has a history of high cholesterol, which she has been attempting to manage through dietary changes, including increased consumption of fish and chicken. Despite these efforts, her cholesterol levels have not improved. She smokes slightly less than a pack a day and acknowledges the health risks but finds it challenging to quit due to stress and anxiety.  States has heart murmur, due to pin-sized hole on the left side of heart, identified during childhood. Her family history includes an uncle with a larger heart murmur that required surgical intervention. Her family history also includes obesity, heart attacks, and type 2 diabetes, with her mother and uncle having died of heart attacks related to obesity.  She has a history of endometriosis and polycystic ovarian syndrome, with dense breast tissue and polycystic changes in her right breast. She has undergone two surgeries related to cysts and endometriosis. Currently, she is on a double dose of birth control to manage her symptoms, which has resulted in improved pain control - sees OBGYN for mgmt  She experiences anxiety  and depression, with anxiety being more prominent at present. She has been in and out of therapy and has developed coping mechanisms. Previous medication for depression led to adverse effects, prompting discontinuation. No current suicidal thoughts.  She is lactose intolerant and has a history of vitamin D  deficiency, which she manages through dietary adjustments. She has had precancerous cells removed from her cervix and continues regular follow-ups with her gynecologist.  She has multiple skin allergies, including sensitivity to detergents and soaps, and experiences eczema between her fingers when stressed. She manages this with steroid creams and uses products designed for sensitive skin.  She has keratoconus, with her left eye being more affected, and has undergone corneal cross-linking to halt the progression of the condition. Her vision in the left eye is significantly impaired without correction.     Outpatient Encounter Medications as of 10/15/2023  Medication Sig   norethindrone  (AYGESTIN ) 5 MG tablet Take 2 tablets (10 mg total) by mouth daily.   Omega-3 Fatty Acids (FISH OIL) 1000 MG CAPS Take 1,000 mg by mouth daily.   No facility-administered encounter medications on file as of 10/15/2023.    Past Medical History:  Diagnosis Date   Allergy    Anxiety    Depression    Dysmenorrhea    Endometriosis    Heart murmur    pinhole size murmur   History of substance abuse (HCC)    Percocet   HLD (hyperlipidemia)    Infertility, female    Lactose intolerance    Menorrhagia    Vitamin D  deficiency  Past Surgical History:  Procedure Laterality Date   CYSTECTOMY     @ age 24yrs, rt ovary    EYE SURGERY     LEEP N/A 08/26/2021   Procedure: LOOP ELECTROSURGICAL EXCISION PROCEDURE (LEEP);  Surgeon: Connell Davies, MD;  Location: [facility] ORS;  Service: Gynecology;  Laterality: N/A;    Family History  Problem Relation Age of Onset   Obesity Mother        ? cause of death  sepsis    Depression Mother    Mental illness Mother    Diabetes Mother    Hypertension Mother    Healthy Father    Miscarriages / Stillbirths Maternal Grandmother    Dementia Maternal Grandmother    Breast cancer Other    Skin cancer Other     Social History   Socioeconomic History   Marital status: Married    Spouse name: Mario Voong   Number of children: Not on file   Years of education: Not on file   Highest education level: Not on file  Occupational History   Not on file  Tobacco Use   Smoking status: Every Day    Current packs/day: 0.50    Average packs/day: 0.5 packs/day for 25.0 years (12.5 ttl pk-yrs)    Types: Cigarettes   Smokeless tobacco: Never  Vaping Use   Vaping status: Never Used  Substance and Sexual Activity   Alcohol use: Not Currently   Drug use: Not Currently    Comment: history of substance abuse (Percocet)   Sexual activity: Yes    Birth control/protection: None, Pill  Other Topics Concern   Not on file  Social History Narrative   Dogs    Smoker    [employer] [phone]   Divorced    Smoker since age 89 y.o   Social Drivers of Corporate investment banker Strain: Not on file  Food Insecurity: Not on file  Transportation Needs: Not on file  Physical Activity: Not on file  Stress: Not on file  Social Connections: Not on file  Intimate Partner Violence: Not on file    Review of Systems  All other systems reviewed and are negative.       Objective    BP 120/84 (BP Location: Left Arm, Patient Position: Sitting, Cuff Size: Normal)   Pulse 77   Ht 5' (1.524 m)   Wt 119 lb 1.6 oz (54 kg)   SpO2 100%   BMI 23.26 kg/m   Physical Exam Constitutional:      General: She is not in acute distress.    Appearance: Normal appearance. She is normal weight. She is not toxic-appearing or diaphoretic.  HENT:     Head: Normocephalic.     Right Ear: Tympanic membrane, ear canal and external ear normal. There is no impacted  cerumen.     Left Ear: Tympanic membrane, ear canal and external ear normal. There is no impacted cerumen.     Nose: Nose normal.     Mouth/Throat:     Mouth: Mucous membranes are moist.     Pharynx: Oropharynx is clear.   Eyes:     Extraocular Movements: Extraocular movements intact.     Pupils: Pupils are equal, round, and reactive to light.    Cardiovascular:     Rate and Rhythm: Normal rate and regular rhythm.     Pulses: Normal pulses.     Heart sounds: Normal heart sounds. No murmur heard.    No  friction rub. No gallop.     Comments: Unable to hear heart murmur Pulmonary:     Effort: Pulmonary effort is normal. No respiratory distress.     Breath sounds: Normal breath sounds. No stridor. No wheezing, rhonchi or rales.  Chest:     Chest wall: No tenderness.   Musculoskeletal:     Right lower leg: No edema.     Left lower leg: No edema.   Skin:    General: Skin is warm and dry.     Capillary Refill: Capillary refill takes less than 2 seconds.   Neurological:     General: No focal deficit present.     Mental Status: She is alert and oriented to person, place, and time. Mental status is at baseline.   Psychiatric:        Mood and Affect: Mood normal.        Behavior: Behavior normal.        Thought Content: Thought content normal.        Judgment: Judgment normal.         Assessment & Plan:  Assessment and Plan Assessment & Plan Tobacco Use Disorder Current smoker, less than a pack per day. Open to smoking cessation and anxiety management medications. - Provided information on smoking cessation medications that may aid anxiety. - Encouraged reduction in cigarette consumption.  Hyperlipidemia Persistent hyperlipidemia despite dietary changes. Smoking cessation advised. Inconsistent fish oil use. - Encourage smoking cessation. - Advise continuation of dietary modifications. - Recommend resuming fish oil supplementation. - Will check lipid  Elevated Blood  Pressure reading - GOAL <119/79 - limit sodium , processed foods - increase exercise - water drink of choice - decrease/ stop tobacco use  Endometriosis Managed with aygestin . Occasional menopausal symptoms - night sweats. Declined hysterectomy previously, now regrets due to pain- All managed by OBGYN - Monitor for menopausal symptoms and adjust treatment as necessary.  Keratoconus Significant progression in left eye, treated with corneal cross-linking. Vision corrected to 20/25 with contacts. - Advise continued use of corrective lenses. - Continue to see optho  Depression Medications and referral to psych discussed Pt would like to hold off.   Vitamin D  Deficiency Deficiency likely due to lactose intolerance and dietary limitations. Difficulty obtaining sufficient vitamin D  from diet alone. - Order vitamin D  level. - Advise on dietary sources of vitamin D  and consider supplementation if levels are low.  General Health Maintenance Due for mammogram and routine lab work.  Screenings for diabetes, HIV, and Hepatitis C recommended. - Order mammogram. - Order comprehensive lab work including kidney and liver function tests, CBC, thyroid levels, A1c, HIV, Hepatitis C, B12, and vitamin D  levels.   Elevated blood pressure reading in office without diagnosis of hypertension -     Lipid panel -     Comprehensive metabolic panel with GFR  Vitamin D  deficiency -     VITAMIN D  25 Hydroxy (Vit-D Deficiency, Fractures)  Elevated LDL cholesterol level -     Lipid panel  Depression, major, single episode, moderate (HCC) -     TSH  Tobacco abuse  Encounter for screening mammogram for malignant neoplasm of breast -     3D Screening Mammogram, Left and Right; Future  Screening for deficiency anemia -     CBC  Family history of diabetes mellitus (DM) -     Hemoglobin A1c  Encounter for screening for viral disease -     Hepatitis C antibody -     HIV  Antibody (routine testing w  rflx)  Encounter to establish care with new doctor    Return in about 6 months (around 04/15/2024) for annual physical.   I, Anita DELENA Boom, FNP, have reviewed all documentation for this visit. The documentation on 10/16/23 for the exam, diagnosis, procedures, and orders are all accurate and complete.   Anita DELENA Boom, FNP

## 2023-10-15 NOTE — Patient Instructions (Signed)

## 2023-10-16 ENCOUNTER — Ambulatory Visit: Payer: Self-pay | Admitting: Family Medicine

## 2023-10-16 DIAGNOSIS — E785 Hyperlipidemia, unspecified: Secondary | ICD-10-CM

## 2023-10-16 LAB — CBC
Hematocrit: 49 % — ABNORMAL HIGH (ref 34.0–46.6)
Hemoglobin: 16.4 g/dL — ABNORMAL HIGH (ref 11.1–15.9)
MCH: 33.5 pg — ABNORMAL HIGH (ref 26.6–33.0)
MCHC: 33.5 g/dL (ref 31.5–35.7)
MCV: 100 fL — ABNORMAL HIGH (ref 79–97)
Platelets: 310 10*3/uL (ref 150–450)
RBC: 4.89 x10E6/uL (ref 3.77–5.28)
RDW: 12.7 % (ref 11.7–15.4)
WBC: 7.2 10*3/uL (ref 3.4–10.8)

## 2023-10-16 LAB — COMPREHENSIVE METABOLIC PANEL WITH GFR
ALT: 35 IU/L — ABNORMAL HIGH (ref 0–32)
AST: 19 IU/L (ref 0–40)
Albumin: 4.4 g/dL (ref 3.9–4.9)
Alkaline Phosphatase: 73 IU/L (ref 44–121)
BUN/Creatinine Ratio: 10 (ref 9–23)
BUN: 8 mg/dL (ref 6–24)
Bilirubin Total: 0.6 mg/dL (ref 0.0–1.2)
CO2: 21 mmol/L (ref 20–29)
Calcium: 9.3 mg/dL (ref 8.7–10.2)
Chloride: 101 mmol/L (ref 96–106)
Creatinine, Ser: 0.79 mg/dL (ref 0.57–1.00)
Globulin, Total: 2.5 g/dL (ref 1.5–4.5)
Glucose: 81 mg/dL (ref 70–99)
Potassium: 4.4 mmol/L (ref 3.5–5.2)
Sodium: 137 mmol/L (ref 134–144)
Total Protein: 6.9 g/dL (ref 6.0–8.5)
eGFR: 96 mL/min/{1.73_m2} (ref >59)

## 2023-10-16 LAB — VITAMIN D 25 HYDROXY (VIT D DEFICIENCY, FRACTURES): Vit D, 25-Hydroxy: 21.2 ng/mL — ABNORMAL LOW (ref 30.0–100.0)

## 2023-10-16 LAB — HEMOGLOBIN A1C
Est. average glucose Bld gHb Est-mCnc: 94 mg/dL
Hgb A1c MFr Bld: 4.9 % (ref 4.8–5.6)

## 2023-10-16 LAB — LIPID PANEL
Chol/HDL Ratio: 9.9 ratio — ABNORMAL HIGH (ref 0.0–4.4)
Cholesterol, Total: 228 mg/dL — ABNORMAL HIGH (ref 100–199)
HDL: 23 mg/dL — ABNORMAL LOW (ref >39)
LDL Chol Calc (NIH): 192 mg/dL — ABNORMAL HIGH (ref 0–99)
Triglycerides: 72 mg/dL (ref 0–149)
VLDL Cholesterol Cal: 13 mg/dL (ref 5–40)

## 2023-10-16 LAB — HIV ANTIBODY (ROUTINE TESTING W REFLEX): HIV Screen 4th Generation wRfx: NONREACTIVE

## 2023-10-16 LAB — HEPATITIS C ANTIBODY: Hep C Virus Ab: NONREACTIVE

## 2023-10-16 LAB — TSH: TSH: 1.25 u[IU]/mL (ref 0.450–4.500)

## 2023-10-16 MED ORDER — ROSUVASTATIN CALCIUM 10 MG PO TABS: 10.0000 mg | ORAL_TABLET | Freq: Every day | ORAL | 3 refills | Status: AC

## 2023-10-19 ENCOUNTER — Other Ambulatory Visit: Payer: Self-pay | Admitting: Family Medicine

## 2023-10-19 DIAGNOSIS — R928 Other abnormal and inconclusive findings on diagnostic imaging of breast: Secondary | ICD-10-CM

## 2023-10-19 DIAGNOSIS — N6489 Other specified disorders of breast: Secondary | ICD-10-CM

## 2023-10-19 DIAGNOSIS — Z1231 Encounter for screening mammogram for malignant neoplasm of breast: Secondary | ICD-10-CM

## 2023-11-09 ENCOUNTER — Ambulatory Visit: Payer: Self-pay

## 2023-11-09 NOTE — Telephone Encounter (Signed)
 FYI Only or Action Required?: FYI only for provider.  Patient was last seen in primary care on 10/15/2023 by Anita Curtis LABOR, FNP.  Called Nurse Triage reporting Dizziness.  Symptoms began x 3 days.  Interventions attempted: Nothing.  Symptoms are: unchanged.  Triage Disposition: See PCP When Office is Open (Within 3 Days)  Patient/caregiver understands and will follow disposition?: Yes   Copied from CRM [id number]. Topic: Clinical - Red Word Triage >> Nov 09, 2023  1:41 PM [name] wrote: Red Word that prompted transfer to Nurse Triage: Pt keeps having dizzy spells , last 3 nights in a row and then twice today. She describes it as feeling drunk and laying down and the room spins Reason for Disposition  [1] MODERATE dizziness (e.g., interferes with normal activities) AND [2] has been evaluated by doctor (or NP/PA) for this  Answer Assessment - Initial Assessment Questions 1. DESCRIPTION: Describe your dizziness.     feeling drunk and laying down and the room spins  2. LIGHTHEADED: Do you feel lightheaded? (e.g., somewhat faint, woozy, weak upon standing)     Weak upon standing 3. VERTIGO: Do you feel like either you or the room is spinning or tilting? (i.e., vertigo)     Spinning 4. SEVERITY: How bad is it?  Do you feel like you are going to faint? Can you stand and walk?     severe 5. ONSET:  When did the dizziness begin?     X 3 days 6. AGGRAVATING FACTORS: Does anything make it worse? (e.g., standing, change in head position)     na 7. HEART RATE: Can you tell me your heart rate? How many beats in 15 seconds?  (Note: Not all patients can do this.)       na 8. CAUSE: What do you think is causing the dizziness? (e.g., decreased fluids or food, diarrhea, emotional distress, heat exposure, new medicine, sudden standing, vomiting; unknown)     unsure 9. RECURRENT SYMPTOM: Have you had dizziness before? If Yes, ask: When was the last time? What happened  that time?     no 10. OTHER SYMPTOMS: Do you have any other symptoms? (e.g., fever, chest pain, vomiting, diarrhea, bleeding)       na 11. PREGNANCY: Is there any chance you are pregnant? When was your last menstrual period?       na  Protocols used: Dizziness - Lightheadedness-A-AH

## 2023-11-11 ENCOUNTER — Ambulatory Visit: Admitting: Family Medicine

## 2023-11-11 ENCOUNTER — Encounter: Payer: Self-pay | Admitting: Family Medicine

## 2023-11-11 VITALS — BP 120/82 | HR 73 | Ht 60.0 in | Wt 120.8 lb

## 2023-11-11 DIAGNOSIS — R270 Ataxia, unspecified: Secondary | ICD-10-CM

## 2023-11-11 DIAGNOSIS — R42 Dizziness and giddiness: Secondary | ICD-10-CM | POA: Diagnosis not present

## 2023-11-11 MED ORDER — MECLIZINE HCL 12.5 MG PO TABS: 12.5000 mg | ORAL_TABLET | Freq: Three times a day (TID) | ORAL | 0 refills | Status: AC | PRN

## 2023-11-11 NOTE — Progress Notes (Signed)
 Established patient visit   Patient: Anita Hobbs   DOB: 06/20/1980   42 y.o. Female  MRN: [id number] Visit Date: 11/11/2023  Today's healthcare provider: Nancyann Perry, MD   Chief Complaint  Patient presents with   Dizziness    When laying down rooms starts to spin last 5-7 days. But ongoing for 1 week and a half.   Subjective    Discussed the use of AI scribe software for clinical note transcription with the patient, who gave verbal consent to proceed.  History of Present Illness   Anita Hobbs is a 43 year old female who presents with dizzy spells and spinning sensation.  She has been experiencing dizzy spells for the past week and a half, described as the room spinning, particularly when laying down or moving in certain ways, such as rolling over. Episodes last about 15 to 20 seconds and may occur multiple times a night. She notes increased wobbliness at night, occasionally hitting her arm on doorways and needing to grab hold of the doorway for support.  No recent illness, such as colds, sinus problems, fevers, or chills, although she mentions having chronic sinus issues. No nausea, vision problems, or ear pain recently. She has a history of ear infections as a child, particularly in the left ear, where she had tubes placed.  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 She experienced light sensitivity during recovery but reports improvement since then. Occasionally, she experiences light sensitivity if the light hits her eyes at a certain angle.  She has not taken any medications like dramamine for dizziness before. Her pharmacy is [facility] in [place].       Medications: Outpatient Medications Prior to Visit  Medication Sig   norethindrone  (AYGESTIN ) 5 MG tablet Take 2 tablets (10 mg total) by mouth daily.   Omega-3 Fatty Acids (FISH OIL) 1000 MG CAPS Take 1,000 mg by  mouth daily.   rosuvastatin  (CRESTOR ) 10 MG tablet Take 1 tablet (10 mg total) by mouth daily.   No facility-administered medications prior to visit.   Review of Systems  Constitutional:  Negative for appetite change, chills, fatigue and fever.  Respiratory:  Negative for chest tightness and shortness of breath.   Cardiovascular:  Negative for chest pain and palpitations.  Gastrointestinal:  Negative for abdominal pain, nausea and vomiting.  Neurological:  Negative for dizziness and weakness.       Objective    BP 120/82 (BP Location: Right Arm, Patient Position: Sitting, Cuff Size: Normal)   Pulse 73   Ht 5' (1.524 m)   Wt 120 lb 12.8 oz (54.8 kg)   SpO2 100%   BMI 23.59 kg/m   Physical Exam  General Appearance:    Well developed, well nourished female, alert, cooperative, in no acute distress  HENT:   ENT exam normal, no neck nodes or sinus tenderness  Eyes:    PERRL, conjunctiva/corneas clear, EOM's intact       Lungs:     Clear to auscultation bilaterally, respirations unlabored  Heart:    Normal heart rate. Normal rhythm. No murmurs, rubs, or gallops.    Neurologic:   Awake, alert, oriented x 3. Dix-Hallpick positive on right    Assessment & Plan     1. Vertigo (Primary) Epley maneuver performed in office. No symptoms after sequence completed. Given printed instruction on performing at home if needed.   - meclizine  (  ANTIVERT ) 12.5 MG tablet; Take 1-2 tablets (12.5-25 mg total) by mouth 3 (three) times daily as needed for dizziness.  Dispense: 30 tablet; Refill: 0  Call for ENT referral if sx not resolved in a week.   2. Ataxia Occurring at night when walking through house in dark to go to the restroom. Let me know if this becomes a more persistent symptom.        Nancyann Perry, MD  [facility] [phone] (phone) [phone] (fax)  [facility]

## 2023-11-11 NOTE — Patient Instructions (Signed)
 Anita Hobbs  Please review the attached list of medications and notify my office if there are any errors.   . Please bring all of your medications to every appointment so we can make sure that our medication list is the same as yours.

## 2023-11-17 ENCOUNTER — Ambulatory Visit
Admission: RE | Admit: 2023-11-17 | Discharge: 2023-11-17 | Disposition: A | Discharge status: Home / Self Care | Source: Ambulatory Visit | Attending: Family Medicine | Admitting: Family Medicine

## 2023-11-17 DIAGNOSIS — R928 Other abnormal and inconclusive findings on diagnostic imaging of breast: Secondary | ICD-10-CM

## 2023-11-17 DIAGNOSIS — N6489 Other specified disorders of breast: Secondary | ICD-10-CM | POA: Insufficient documentation

## 2023-11-17 DIAGNOSIS — Z1231 Encounter for screening mammogram for malignant neoplasm of breast: Secondary | ICD-10-CM | POA: Diagnosis present

## 2023-11-18 ENCOUNTER — Ambulatory Visit: Payer: Self-pay | Admitting: Family Medicine

## 2023-11-24 ENCOUNTER — Inpatient Hospital Stay

## 2023-11-24 ENCOUNTER — Other Ambulatory Visit: Payer: Self-pay

## 2023-11-24 ENCOUNTER — Inpatient Hospital Stay: Attending: Oncology | Admitting: Oncology

## 2023-11-24 ENCOUNTER — Encounter: Payer: Self-pay | Admitting: Oncology

## 2023-11-24 VITALS — BP 127/85 | HR 74 | Temp 98.3°F | Resp 16 | Ht 60.0 in | Wt 121.2 lb

## 2023-11-24 DIAGNOSIS — E559 Vitamin D deficiency, unspecified: Secondary | ICD-10-CM | POA: Insufficient documentation

## 2023-11-24 DIAGNOSIS — Z79899 Other long term (current) drug therapy: Secondary | ICD-10-CM | POA: Insufficient documentation

## 2023-11-24 DIAGNOSIS — N920 Excessive and frequent menstruation with regular cycle: Secondary | ICD-10-CM | POA: Insufficient documentation

## 2023-11-24 DIAGNOSIS — F17210 Nicotine dependence, cigarettes, uncomplicated: Secondary | ICD-10-CM | POA: Diagnosis not present

## 2023-11-24 DIAGNOSIS — E785 Hyperlipidemia, unspecified: Secondary | ICD-10-CM | POA: Diagnosis not present

## 2023-11-24 DIAGNOSIS — D751 Secondary polycythemia: Secondary | ICD-10-CM

## 2023-11-24 DIAGNOSIS — R011 Cardiac murmur, unspecified: Secondary | ICD-10-CM | POA: Diagnosis not present

## 2023-11-24 DIAGNOSIS — Z803 Family history of malignant neoplasm of breast: Secondary | ICD-10-CM | POA: Diagnosis not present

## 2023-11-24 DIAGNOSIS — N6489 Other specified disorders of breast: Secondary | ICD-10-CM | POA: Diagnosis not present

## 2023-11-24 DIAGNOSIS — Z793 Long term (current) use of hormonal contraceptives: Secondary | ICD-10-CM | POA: Insufficient documentation

## 2023-11-24 LAB — URINALYSIS, COMPLETE (UACMP) WITH MICROSCOPIC
Bilirubin Urine: NEGATIVE
Glucose, UA: NEGATIVE mg/dL
Ketones, ur: NEGATIVE mg/dL
Leukocytes,Ua: NEGATIVE
Nitrite: NEGATIVE
Protein, ur: NEGATIVE mg/dL
Specific Gravity, Urine: 1.004 — ABNORMAL LOW (ref 1.005–1.030)
pH: 5 (ref 5.0–8.0)

## 2023-11-24 LAB — CBC WITH DIFFERENTIAL/PLATELET
Abs Immature Granulocytes: 0.03 K/uL (ref 0.00–0.07)
Basophils Absolute: 0.1 K/uL (ref 0.0–0.1)
Basophils Relative: 1 %
Eosinophils Absolute: 0.1 K/uL (ref 0.0–0.5)
Eosinophils Relative: 1 %
HCT: 49.5 % — ABNORMAL HIGH (ref 36.0–46.0)
Hemoglobin: 17.3 g/dL — ABNORMAL HIGH (ref 12.0–15.0)
Immature Granulocytes: 0 %
Lymphocytes Relative: 33 %
Lymphs Abs: 2.9 K/uL (ref 0.7–4.0)
MCH: 33.8 pg (ref 26.0–34.0)
MCHC: 34.9 g/dL (ref 30.0–36.0)
MCV: 96.7 fL (ref 80.0–100.0)
Monocytes Absolute: 0.4 K/uL (ref 0.1–1.0)
Monocytes Relative: 5 %
Neutro Abs: 5.2 K/uL (ref 1.7–7.7)
Neutrophils Relative %: 60 %
Platelets: 261 K/uL (ref 150–400)
RBC: 5.12 MIL/uL — ABNORMAL HIGH (ref 3.87–5.11)
RDW: 12.8 % (ref 11.5–15.5)
WBC: 8.6 K/uL (ref 4.0–10.5)
nRBC: 0 % (ref 0.0–0.2)

## 2023-11-24 NOTE — Progress Notes (Signed)
 Stomach thing / diarrhea yesterday, didn't know if food poisoning or stomach bug but is better today but not fully resolved.  Dizziness onset about one month ago. Patient meets with shrink regularly for depression and anxiety.  Bouncing back between 1st and 2nd shift; tired.

## 2023-11-25 LAB — ERYTHROPOIETIN: Erythropoietin: 5.6 m[IU]/mL (ref 2.6–18.5)

## 2023-11-28 NOTE — Progress Notes (Signed)
 Hematology/Oncology Consult note [facility] Telephone:([phone] [phone]  Patient Care Team: Wellington Curtis LABOR, FNP as PCP - General (Family Medicine) Connell Davies, MD (Inactive) as Referring Physician (Obstetrics and Gynecology)   Name of the patient: Anita Hobbs  [id number]  07-13-1980    Reason for referral-polycythemia   Referring physician-Kelly Wellington, FNP  Date of visit: 11/28/23   History of presenting illness- Patient is a 42 year old female who was last seen by me in 2021 for thrombocytosis which was believed to be reactive secondary to iron deficiency.  She has now been referred for polycythemia.  Patient's hemoglobin up until April 2021 was around 15 and since the last 2 years her hemoglobin has been between 16-17.  Patient denies any changes in her medications.  She reports getting a good night sleep and denies any symptoms of snoring or daytime drowsiness.  She is a non-smoker.  Denies any exogenous testosterone use.  ECOG PS- 1  Pain scale- 0   Review of systems- Review of Systems  Constitutional:  Positive for malaise/fatigue. Negative for chills, fever and weight loss.  HENT:  Negative for congestion, ear discharge and nosebleeds.   Eyes:  Negative for blurred vision.  Respiratory:  Negative for cough, hemoptysis, sputum production, shortness of breath and wheezing.   Cardiovascular:  Negative for chest pain, palpitations, orthopnea and claudication.  Gastrointestinal:  Negative for abdominal pain, blood in stool, constipation, diarrhea, heartburn, melena, nausea and vomiting.  Genitourinary:  Negative for dysuria, flank pain, frequency, hematuria and urgency.  Musculoskeletal:  Negative for back pain, joint pain and myalgias.  Skin:  Negative for rash.  Neurological:  Negative for dizziness, tingling, focal weakness, seizures, weakness and headaches.  Endo/Heme/Allergies:  Does not bruise/bleed easily.   Psychiatric/Behavioral:  Negative for depression and suicidal ideas. The patient does not have insomnia.     No Known Allergies  Patient Active Problem List   Diagnosis Date Noted   Annual physical exam 08/02/2019   Tobacco abuse 05/03/2019   Anxiety and depression 05/03/2019   Thrombocytosis 05/03/2019   HLD (hyperlipidemia)    Menorrhagia with irregular cycle 12/07/2018   Dysmenorrhea 01/25/2018   Endometriosis 01/25/2018     Past Medical History:  Diagnosis Date   Allergy    Anxiety    Depression    Dysmenorrhea    Endometriosis    Heart murmur    pinhole size murmur   History of substance abuse (HCC)    Percocet   HLD (hyperlipidemia)    Infertility, female    Lactose intolerance    Menorrhagia    Vitamin D  deficiency      Past Surgical History:  Procedure Laterality Date   CYSTECTOMY     @ age 46yrs, rt ovary    EYE SURGERY     LEEP N/A 08/26/2021   Procedure: LOOP ELECTROSURGICAL EXCISION PROCEDURE (LEEP);  Surgeon: Connell Davies, MD;  Location: [facility] ORS;  Service: Gynecology;  Laterality: N/A;    Social History   Socioeconomic History   Marital status: Married    Spouse name: Kalaya Infantino   Number of children: Not on file   Years of education: Not on file   Highest education level: Not on file  Occupational History   Not on file  Tobacco Use   Smoking status: Every Day    Current packs/day: 0.50    Average packs/day: 0.5 packs/day for 25.0 years (12.5 ttl pk-yrs)    Types: Cigarettes   Smokeless tobacco: Never  Vaping Use   Vaping status: Never Used  Substance and Sexual Activity   Alcohol use: Not Currently   Drug use: Not Currently    Comment: history of substance abuse (Percocet)   Sexual activity: Yes    Birth control/protection: None, Pill  Other Topics Concern   Not on file  Social History Narrative   Dogs    Smoker    DPR Dierdre Mccalip [phone]   Divorced    Smoker since age 89 y.o   Social Drivers of Manufacturing engineer Strain: Not on file  Food Insecurity: No Food Insecurity (11/24/2023)   Hunger Vital Sign    Worried About Running Out of Food in the Last Year: Never true    Ran Out of Food in the Last Year: Never true  Transportation Needs: No Transportation Needs (11/24/2023)   PRAPARE - Administrator, Civil Service (Medical): No    Lack of Transportation (Non-Medical): No  Physical Activity: Not on file  Stress: Not on file  Social Connections: Not on file  Intimate Partner Violence: Not At Risk (11/24/2023)   Humiliation, Afraid, Rape, and Kick questionnaire    Fear of Current or Ex-Partner: No    Emotionally Abused: No    Physically Abused: No    Sexually Abused: No     Family History  Problem Relation Age of Onset   Obesity Mother        ? cause of death sepsis    Depression Mother    Mental illness Mother    Diabetes Mother    Hypertension Mother    Healthy Father    Miscarriages / Stillbirths Maternal Grandmother    Dementia Maternal Grandmother    Breast cancer Other    Skin cancer Other      Current Outpatient Medications:    meclizine  (ANTIVERT ) 12.5 MG tablet, Take 1-2 tablets (12.5-25 mg total) by mouth 3 (three) times daily as needed for dizziness., Disp: 30 tablet, Rfl: 0   norethindrone  (AYGESTIN ) 5 MG tablet, Take 2 tablets (10 mg total) by mouth daily., Disp: 180 tablet, Rfl: 2   Omega-3 Fatty Acids (FISH OIL) 1000 MG CAPS, Take 1,000 mg by mouth daily., Disp: , Rfl:    rosuvastatin  (CRESTOR ) 10 MG tablet, Take 1 tablet (10 mg total) by mouth daily., Disp: 90 tablet, Rfl: 3   Physical exam:  Vitals:   11/24/23 1443  BP: 127/85  Pulse: 74  Resp: 16  Temp: 98.3 F (36.8 C)  TempSrc: Tympanic  SpO2: 100%  Weight: 121 lb 3.2 oz (55 kg)  Height: 5' (1.524 m)   Physical Exam Cardiovascular:     Rate and Rhythm: Normal rate and regular rhythm.     Heart sounds: Normal heart sounds.  Pulmonary:     Effort: Pulmonary effort is  normal.     Breath sounds: Normal breath sounds.  Abdominal:     General: Bowel sounds are normal.     Palpations: Abdomen is soft.     Comments: No palpable splenomegaly  Skin:    General: Skin is warm and dry.  Neurological:     Mental Status: She is alert and oriented to person, place, and time.           Latest Ref Rng & Units 10/15/2023    3:45 PM  CMP  Glucose 70 - 99 mg/dL 81   BUN 6 - 24 mg/dL 8   Creatinine 9.42 - 8.99 mg/dL  0.79   Sodium 134 - 144 mmol/L 137   Potassium 3.5 - 5.2 mmol/L 4.4   Chloride 96 - 106 mmol/L 101   CO2 20 - 29 mmol/L 21   Calcium  8.7 - 10.2 mg/dL 9.3   Total Protein 6.0 - 8.5 g/dL 6.9   Total Bilirubin 0.0 - 1.2 mg/dL 0.6   Alkaline Phos 44 - 121 IU/L 73   AST 0 - 40 IU/L 19   ALT 0 - 32 IU/L 35       Latest Ref Rng & Units 11/24/2023    3:31 PM  CBC  WBC 4.0 - 10.5 K/uL 8.6   Hemoglobin 12.0 - 15.0 g/dL 82.6   Hematocrit 63.9 - 46.0 % 49.5   Platelets 150 - 400 K/uL 261     No images are attached to the encounter.  MM 3D DIAGNOSTIC MAMMOGRAM BILATERAL BREAST Result Date: 11/17/2023 CLINICAL DATA:  43 year old female presenting for 2 year follow-up for probably benign asymmetries in the RIGHT breast, initially assessed in May 2023. History of benign cysts in the right breast. EXAM: DIGITAL DIAGNOSTIC BILATERAL MAMMOGRAM WITH TOMOSYNTHESIS AND CAD TECHNIQUE: Bilateral digital diagnostic mammography and breast tomosynthesis was performed. The images were evaluated with computer-aided detection. COMPARISON:  Previous exam(s). ACR Breast Density Category d: The breasts are extremely dense, which lowers the sensitivity of mammography. FINDINGS: Mammographically stable asymmetries in the retroareolar and outer right breast, not significantly changed since at least April 2023. Given this documented over 2 year stability, this is consistent with a benign etiology. No new suspicious mass, calcification, or other findings are identified in bilateral  breasts. IMPRESSION: 1. Documented 2 year stability of the asymmetries in the RIGHT breast, consistent with a benign etiology, likely representing the patient's baseline configuration of fibroglandular tissue. 2. No mammographic evidence of malignancy in BILATERAL breasts. RECOMMENDATION: Return to routine screening mammography is recommended. The patient will be due for screening in July 2026. I have discussed the findings and recommendations with the patient. If applicable, a reminder letter will be sent to the patient regarding the next appointment. BI-RADS CATEGORY  2: Benign. Electronically Signed   By: Dirk Arrant M.D.   On: 11/17/2023 16:08    Assessment and plan- Patient is a 43 y.o. female referred for polycythemia  Discussed differences between primary polycythemia vera versus secondary polycythemia.  I will order primary polycythemia workup including EPO levels, JAK2 with reflex to CALR and MPL mutations.  Repeat CBC with differential today.  Will check urinalysis for hematuria.  I will see her back for a virtual visit in 2 weeks time   Thank you for this kind referral and the opportunity to participate in the care of this  Patient   Visit Diagnosis 1. Polycythemia     Dr. Annah Skene, MD, MPH [facility] [phone] 11/28/2023

## 2023-12-01 LAB — CALR +MPL + E12-E15  (REFLEX)

## 2023-12-01 LAB — JAK2 V617F RFX CALR/MPL/E12-15

## 2023-12-10 ENCOUNTER — Encounter: Payer: Self-pay | Admitting: Oncology

## 2023-12-10 ENCOUNTER — Inpatient Hospital Stay: Admitting: Oncology

## 2023-12-10 DIAGNOSIS — D751 Secondary polycythemia: Secondary | ICD-10-CM | POA: Diagnosis not present

## 2023-12-10 NOTE — Progress Notes (Signed)
 Patient says she has no new or acute concerns at this time.

## 2023-12-11 NOTE — Telephone Encounter (Signed)
 I did my video visit with ehr after that. I told her to speak to her pcp

## 2023-12-12 NOTE — Progress Notes (Signed)
 I connected with Anita Hobbs on 12/12/23 at 10:30 AM EDT by video enabled telemedicine visit and verified that I am speaking with the correct person using two identifiers.   I discussed the limitations, risks, security and privacy concerns of performing an evaluation and management service by telemedicine and the availability of in-person appointments. I also discussed with the patient that there may be a patient responsible charge related to this service. The patient expressed understanding and agreed to proceed.  Other persons participating in the visit and their role in the encounter:  none  Patient's location:  home Provider's location:  home  Chief Complaint:  discuss results of bloodwork  History of present illness: Patient is a 43 year old female who was last seen by me in 2021 for thrombocytosis which was believed to be reactive secondary to iron deficiency. She has now been referred for polycythemia. Patient's hemoglobin up until April 2021 was around 15 and since the last 2 years her hemoglobin has been between 16-17. Patient denies any changes in her medications. She reports getting a good night sleep and denies any symptoms of snoring or daytime drowsiness. She is a non-smoker. Denies any exogenous testosterone use.   Results of blood work from 11/24/2023 were as follows: CBC showed white count of 8.6, H&H of 17.3/49.5 and a platelet count of 261.  JAK2 mutation testing was negative.  CALR, MPL and exon 12 mutation testing negative.  No EPO levels normal at 5.6.  Interval history Patient has been experiencing symptoms of dizziness and diarrhea after she started taking her cholesterol medications.   Review of Systems  Constitutional:  Negative for chills, fever, malaise/fatigue and weight loss.  HENT:  Negative for congestion, ear discharge and nosebleeds.   Eyes:  Negative for blurred vision.  Respiratory:  Negative for cough, hemoptysis, sputum production, shortness of breath  and wheezing.   Cardiovascular:  Negative for chest pain, palpitations, orthopnea and claudication.  Gastrointestinal:  Negative for abdominal pain, blood in stool, constipation, diarrhea, heartburn, melena, nausea and vomiting.  Genitourinary:  Negative for dysuria, flank pain, frequency, hematuria and urgency.  Musculoskeletal:  Negative for back pain, joint pain and myalgias.  Skin:  Negative for rash.  Neurological:  Positive for dizziness. Negative for tingling, focal weakness, seizures, weakness and headaches.  Endo/Heme/Allergies:  Does not bruise/bleed easily.  Psychiatric/Behavioral:  Negative for depression and suicidal ideas. The patient does not have insomnia.     No Known Allergies  Past Medical History:  Diagnosis Date   Allergy    Anxiety    Depression    Dysmenorrhea    Endometriosis    Heart murmur    pinhole size murmur   History of substance abuse (HCC)    Percocet   HLD (hyperlipidemia)    Infertility, female    Lactose intolerance    Menorrhagia    Vitamin D  deficiency     Past Surgical History:  Procedure Laterality Date   CYSTECTOMY     @ age 36yrs, rt ovary    EYE SURGERY     LEEP N/A 08/26/2021   Procedure: LOOP ELECTROSURGICAL EXCISION PROCEDURE (LEEP);  Surgeon: Connell Davies, MD;  Location: [facility] ORS;  Service: Gynecology;  Laterality: N/A;    Social History   Socioeconomic History   Marital status: Married    Spouse name: Cecille Mcclusky   Number of children: Not on file   Years of education: Not on file   Highest education level: Not on file  Occupational History  Not on file  Tobacco Use   Smoking status: Every Day    Current packs/day: 0.50    Average packs/day: 0.5 packs/day for 25.0 years (12.5 ttl pk-yrs)    Types: Cigarettes   Smokeless tobacco: Never  Vaping Use   Vaping status: Never Used  Substance and Sexual Activity   Alcohol use: Not Currently   Drug use: Not Currently    Comment: history of substance abuse  (Percocet)   Sexual activity: Yes    Birth control/protection: None, Pill  Other Topics Concern   Not on file  Social History Narrative   Dogs    Smoker    DPR Jasmin Winberry [phone]   Divorced    Smoker since age 81 y.o   Social Drivers of Corporate investment banker Strain: Not on file  Food Insecurity: No Food Insecurity (11/24/2023)   Hunger Vital Sign    Worried About Running Out of Food in the Last Year: Never true    Ran Out of Food in the Last Year: Never true  Transportation Needs: No Transportation Needs (11/24/2023)   PRAPARE - Administrator, Civil Service (Medical): No    Lack of Transportation (Non-Medical): No  Physical Activity: Not on file  Stress: Not on file  Social Connections: Not on file  Intimate Partner Violence: Not At Risk (11/24/2023)   Humiliation, Afraid, Rape, and Kick questionnaire    Fear of Current or Ex-Partner: No    Emotionally Abused: No    Physically Abused: No    Sexually Abused: No    Family History  Problem Relation Age of Onset   Obesity Mother        ? cause of death sepsis    Depression Mother    Mental illness Mother    Diabetes Mother    Hypertension Mother    Healthy Father    Miscarriages / Stillbirths Maternal Grandmother    Dementia Maternal Grandmother    Breast cancer Other    Skin cancer Other      Current Outpatient Medications:    meclizine  (ANTIVERT ) 12.5 MG tablet, Take 1-2 tablets (12.5-25 mg total) by mouth 3 (three) times daily as needed for dizziness., Disp: 30 tablet, Rfl: 0   norethindrone  (AYGESTIN ) 5 MG tablet, Take 2 tablets (10 mg total) by mouth daily., Disp: 180 tablet, Rfl: 2   Omega-3 Fatty Acids (FISH OIL) 1000 MG CAPS, Take 1,000 mg by mouth daily., Disp: , Rfl:    rosuvastatin  (CRESTOR ) 10 MG tablet, Take 1 tablet (10 mg total) by mouth daily., Disp: 90 tablet, Rfl: 3  MM 3D DIAGNOSTIC MAMMOGRAM BILATERAL BREAST Result Date: 11/17/2023 CLINICAL DATA:  43 year old female  presenting for 2 year follow-up for probably benign asymmetries in the RIGHT breast, initially assessed in May 2023. History of benign cysts in the right breast. EXAM: DIGITAL DIAGNOSTIC BILATERAL MAMMOGRAM WITH TOMOSYNTHESIS AND CAD TECHNIQUE: Bilateral digital diagnostic mammography and breast tomosynthesis was performed. The images were evaluated with computer-aided detection. COMPARISON:  Previous exam(s). ACR Breast Density Category d: The breasts are extremely dense, which lowers the sensitivity of mammography. FINDINGS: Mammographically stable asymmetries in the retroareolar and outer right breast, not significantly changed since at least April 2023. Given this documented over 2 year stability, this is consistent with a benign etiology. No new suspicious mass, calcification, or other findings are identified in bilateral breasts. IMPRESSION: 1. Documented 2 year stability of the asymmetries in the RIGHT breast, consistent with a benign  etiology, likely representing the patient's baseline configuration of fibroglandular tissue. 2. No mammographic evidence of malignancy in BILATERAL breasts. RECOMMENDATION: Return to routine screening mammography is recommended. The patient will be due for screening in July 2026. I have discussed the findings and recommendations with the patient. If applicable, a reminder letter will be sent to the patient regarding the next appointment. BI-RADS CATEGORY  2: Benign. Electronically Signed   By: Dirk Arrant M.D.   On: 11/17/2023 16:08    No images are attached to the encounter.      Latest Ref Rng & Units 10/15/2023    3:45 PM  CMP  Glucose 70 - 99 mg/dL 81   BUN 6 - 24 mg/dL 8   Creatinine 9.42 - 8.99 mg/dL 9.20   Sodium 865 - 855 mmol/L 137   Potassium 3.5 - 5.2 mmol/L 4.4   Chloride 96 - 106 mmol/L 101   CO2 20 - 29 mmol/L 21   Calcium  8.7 - 10.2 mg/dL 9.3   Total Protein 6.0 - 8.5 g/dL 6.9   Total Bilirubin 0.0 - 1.2 mg/dL 0.6   Alkaline Phos 44 - 121  IU/L 73   AST 0 - 40 IU/L 19   ALT 0 - 32 IU/L 35       Latest Ref Rng & Units 11/24/2023    3:31 PM  CBC  WBC 4.0 - 10.5 K/uL 8.6   Hemoglobin 12.0 - 15.0 g/dL 82.6   Hematocrit 63.9 - 46.0 % 49.5   Platelets 150 - 400 K/uL 261      Observation/objective: Appears in no acute distress over video visit today.  Breathing is nonlabored  Assessment and plan: Patient is a 43 year old female referred for polycythemia likely secondary  Patient is a non-smoker and there is no familial history of polycythemia.  Her hemoglobin was close to 13 up until 2021 and since 2023 it has been between 16-17.  No changes in her medications around that time.  Testing for polycythemia vera including JAK2, CALR, MPL and exon 12 testing negative.  EPO levels are normal.  It is therefore highly unlikely that she has polycythemia vera.  She does not require any phlebotomy at this time.  I would like her to get sleep study to rule out obstructive sleep apnea and this can be done by her primary care provider.  I am inclined to monitor her hematocrit conservatively and only consider phlebotomy if her hematocrit is greater than 55.  CBC in 2 months in 4 months and I will see her back in 4 months.  With regards to recent symptoms of dizziness and diarrhea that patient has been experiencing after starting her cholesterol medications have asked her to speak to her primary care doctor  Follow-up instructions:  I discussed the assessment and treatment plan with the patient. The patient was provided an opportunity to ask questions and all were answered. The patient agreed with the plan and demonstrated an understanding of the instructions.   The patient was advised to call back or seek an in-person evaluation if the symptoms worsen or if the condition fails to improve as anticipated.  I provided 14 minutes of face-to-face video visit time during this encounter, and > 50% was spent counseling as documented under my assessment &  plan.  Visit Diagnosis: 1. Polycythemia, secondary     Dr. Annah Skene, MD, MPH [facility] Tel- [phone] 12/12/2023 12:11 PM

## 2023-12-26 ENCOUNTER — Encounter: Payer: Self-pay | Admitting: Family Medicine

## 2023-12-26 ENCOUNTER — Encounter: Payer: Self-pay | Admitting: Oncology

## 2023-12-29 NOTE — Telephone Encounter (Signed)
 Yes pcp will need to look into this.

## 2023-12-30 ENCOUNTER — Other Ambulatory Visit: Payer: Self-pay | Admitting: Family Medicine

## 2023-12-30 DIAGNOSIS — R400 Somnolence: Secondary | ICD-10-CM

## 2023-12-30 DIAGNOSIS — D582 Other hemoglobinopathies: Secondary | ICD-10-CM

## 2024-01-12 ENCOUNTER — Encounter: Payer: Self-pay | Admitting: Sleep Medicine

## 2024-01-12 ENCOUNTER — Ambulatory Visit: Admitting: Sleep Medicine

## 2024-01-12 VITALS — BP 118/76 | HR 72 | Temp 97.1°F | Ht 60.0 in | Wt 123.4 lb

## 2024-01-12 DIAGNOSIS — G4733 Obstructive sleep apnea (adult) (pediatric): Secondary | ICD-10-CM | POA: Diagnosis not present

## 2024-01-12 DIAGNOSIS — E785 Hyperlipidemia, unspecified: Secondary | ICD-10-CM

## 2024-01-12 NOTE — Progress Notes (Signed)
 Name:Anita Hobbs MRN: [id number] DOB: [birth year]   CHIEF COMPLAINT:  EXCESSIVE DAYTIME SLEEPINESS   HISTORY OF PRESENT ILLNESS: Anita Hobbs is a 43 y.o. w/ a h/o hyperlipidemia who presents for c/o loud snoring and fatigue which has been present for a few years. Reports nocturnal awakenings due to nocturia, however does not have difficulty falling back to sleep. Denies any significant weight changes. Admits to night sweats. Denies morning headaches, RLS symptoms, dream enactment, cataplexy, hypnagogic or hypnapompic hallucinations. Reports a family history of sleep apnea. Denies drowsy driving. Drinks several cups of coffee daily, denies alcohol or illicit drug use. Smokes almost an entire pack of cigarettes daily.   Bedtime 10-11 pm Sleep onset 30 mins Rise time 5:30 am   EPWORTH SLEEP SCORE 2    01/12/2024    2:37 PM  Results of the Epworth flowsheet  Sitting and reading 0  Watching TV 1  Sitting, inactive in a public place (e.g. a theatre or a meeting) 0  As a passenger in a car for an hour without a break 0  Lying down to rest in the afternoon when circumstances permit 1  Sitting and talking to someone 0  Sitting quietly after a lunch without alcohol 0  In a car, while stopped for a few minutes in traffic 0  Total score 2    PAST MEDICAL HISTORY :   has a past medical history of Allergy, Anxiety, Depression, Dysmenorrhea, Endometriosis, Heart murmur, History of substance abuse (HCC), HLD (hyperlipidemia), Infertility, female, Lactose intolerance, Menorrhagia, and Vitamin D  deficiency.  has a past surgical history that includes Cystectomy; LEEP (N/A, 08/26/2021); and Eye surgery. Prior to Admission medications   Medication Sig Start Date End Date Taking? Authorizing Provider  meclizine  (ANTIVERT ) 12.5 MG tablet Take 1-2 tablets (12.5-25 mg total) by mouth 3 (three) times daily as needed for dizziness. 11/11/23   Gasper Nancyann BRAVO, MD  norethindrone  (AYGESTIN )  5 MG tablet Take 2 tablets (10 mg total) by mouth daily. 05/08/23   Connell Davies, MD  Omega-3 Fatty Acids (FISH OIL) 1000 MG CAPS Take 1,000 mg by mouth daily.    [provider]  rosuvastatin  (CRESTOR ) 10 MG tablet Take 1 tablet (10 mg total) by mouth daily. 10/16/23   Wellington Curtis LABOR, FNP   No Known Allergies  FAMILY HISTORY:  family history includes Breast cancer in an other family member; Dementia in her maternal grandmother; Depression in her mother; Diabetes in her mother; Healthy in her father; Hypertension in her mother; Mental illness in her mother; Miscarriages / Stillbirths in her maternal grandmother; Obesity in her mother; Skin cancer in an other family member. SOCIAL HISTORY:  reports that she has been smoking cigarettes. She has a 12.5 pack-year smoking history. She has never used smokeless tobacco. She reports that she does not currently use alcohol. She reports that she does not currently use drugs.   Review of Systems:  Gen:  Denies  fever, sweats, chills weight loss  HEENT: Denies blurred vision, double vision, ear pain, eye pain, hearing loss, nose bleeds, sore throat Cardiac:  No dizziness, chest pain or heaviness, chest tightness,edema, No JVD Resp:   No cough, -sputum production, -shortness of breath,-wheezing, -hemoptysis,  Gi: Denies swallowing difficulty, stomach pain, nausea or vomiting, diarrhea, constipation, bowel incontinence Gu:  Denies bladder incontinence, burning urine Ext:   Denies Joint pain, stiffness or swelling Skin: Denies  skin rash, easy bruising or bleeding or hives Endoc:  Denies polyuria,  polydipsia , polyphagia or weight change Psych:   Denies depression, insomnia or hallucinations  Other:  All other systems negative  VITAL SIGNS: BP 118/76   Pulse 72   Temp (!) 97.1 F (36.2 C)   Ht 5' (1.524 m)   Wt 123 lb 6.4 oz (56 kg)   SpO2 99%   BMI 24.10 kg/m    Physical Examination:   General Appearance: No distress  EYES  PERRLA, EOM intact.   NECK Supple, No JVD Pulmonary: normal breath sounds, No wheezing.  CardiovascularNormal S1,S2.  No m/r/g.   Abdomen: Benign, Soft, non-tender. Skin:   warm, no rashes, no ecchymosis  Extremities: normal, no cyanosis, clubbing. Neuro:without focal findings,  speech normal  PSYCHIATRIC: Mood, affect within normal limits.   ASSESSMENT AND PLAN  OSA I suspect that OSA is likely present due to clinical presentation. Discussed the consequences of untreated sleep apnea. Advised not to drive drowsy for safety of patient and others. Will complete further evaluation with a home sleep study and follow up to review results.    Hyperlipidemia Stable, on current management.    MEDICATION ADJUSTMENTS/LABS AND TESTS ORDERED: Recommend Sleep Study   Patient  satisfied with Plan of action and management. All questions answered  Follow up to review HST results and treatment plan.   I spent a total of 48 minutes reviewing chart data, face-to-face evaluation with the patient, counseling and coordination of care as detailed above.    Ellen Goris, M.D.  Sleep Medicine [facility]

## 2024-01-12 NOTE — Patient Instructions (Signed)
 Anita Hobbs

## 2024-01-15 ENCOUNTER — Other Ambulatory Visit: Payer: Self-pay | Admitting: Family Medicine

## 2024-01-15 DIAGNOSIS — E785 Hyperlipidemia, unspecified: Secondary | ICD-10-CM

## 2024-01-15 NOTE — Telephone Encounter (Signed)
 Copied from CRM [id number]. Topic: Clinical - Medication Refill >> Jan 15, 2024  4:19 PM Yolanda T wrote: Medication: rosuvastatin  (CRESTOR ) 10 MG tablet  Has the patient contacted their pharmacy? Yes (Agent: If no, request that the patient contact the pharmacy for the refill. If patient does not wish to contact the pharmacy document the reason why and proceed with request.) (Agent: If yes, when and what did the pharmacy advise?)  This is the patient's preferred pharmacy:   [facility] - [place], [place] - [address] [place] [address] Phone: [phone] Fax: [phone]  Is this the correct pharmacy for this prescription? Yes  Has the prescription been filled recently? Yes  Is the patient out of the medication? No  Has the patient been seen for an appointment in the last year OR does the patient have an upcoming appointment? Yes  Can we respond through MyChart? Yes  Agent: Please be advised that Rx refills may take up to 3 business days. We ask that you follow-up with your pharmacy.

## 2024-01-18 ENCOUNTER — Telehealth: Payer: Self-pay | Admitting: Family Medicine

## 2024-01-18 ENCOUNTER — Other Ambulatory Visit: Payer: Self-pay

## 2024-01-18 NOTE — Telephone Encounter (Signed)
 Too soon for refill.  Requested Prescriptions  Pending Prescriptions Disp Refills   rosuvastatin  (CRESTOR ) 10 MG tablet 90 tablet 3    Sig: Take 1 tablet (10 mg total) by mouth daily.     Cardiovascular:  Antilipid - Statins 2 Failed - 01/18/2024  4:30 PM      Failed - Lipid Panel in normal range within the last 12 months    Cholesterol, Total  Date Value Ref Range Status  10/15/2023 228 (H) 100 - 199 mg/dL Final   LDL Chol Calc (NIH)  Date Value Ref Range Status  10/15/2023 192 (H) 0 - 99 mg/dL Final   HDL  Date Value Ref Range Status  10/15/2023 23 (L) >39 mg/dL Final   Triglycerides  Date Value Ref Range Status  10/15/2023 72 0 - 149 mg/dL Final         Passed - Cr in normal range and within 360 days    Creatinine, Ser  Date Value Ref Range Status  10/15/2023 0.79 0.57 - 1.00 mg/dL Final         Passed - Patient is not pregnant      Passed - Valid encounter within last 12 months    Recent Outpatient Visits           2 months ago Vertigo   [facility] Gasper Nancyann BRAVO, MD   3 months ago Elevated blood pressure reading in office without diagnosis of hypertension   [facility] Wellington Curtis LABOR, FNP       Future Appointments             In 3 months Melanee Annah BROCKS, MD [facility] [place]

## 2024-01-18 NOTE — Telephone Encounter (Signed)
[facility] faxed refill request for the following medications:  rosuvastatin (CRESTOR) 10 MG tablet   Please advise. 

## 2024-01-24 ENCOUNTER — Encounter: Admitting: Sleep Medicine

## 2024-01-24 DIAGNOSIS — G4733 Obstructive sleep apnea (adult) (pediatric): Secondary | ICD-10-CM

## 2024-02-01 ENCOUNTER — Encounter: Payer: Self-pay | Admitting: Sleep Medicine

## 2024-02-02 DIAGNOSIS — R069 Unspecified abnormalities of breathing: Secondary | ICD-10-CM | POA: Diagnosis not present

## 2024-02-02 NOTE — Telephone Encounter (Signed)
 SNAP Website states it is pending Dr. Jess signature

## 2024-02-03 NOTE — Telephone Encounter (Signed)
 HST has been scanned into the patient's chart

## 2024-02-04 ENCOUNTER — Ambulatory Visit (INDEPENDENT_AMBULATORY_CARE_PROVIDER_SITE_OTHER): Admitting: Sleep Medicine

## 2024-02-04 ENCOUNTER — Encounter: Payer: Self-pay | Admitting: Sleep Medicine

## 2024-02-04 VITALS — BP 124/70 | Ht 60.0 in | Wt 125.0 lb

## 2024-02-04 DIAGNOSIS — G4733 Obstructive sleep apnea (adult) (pediatric): Secondary | ICD-10-CM | POA: Diagnosis not present

## 2024-02-04 DIAGNOSIS — F17210 Nicotine dependence, cigarettes, uncomplicated: Secondary | ICD-10-CM | POA: Diagnosis not present

## 2024-02-04 NOTE — Patient Instructions (Signed)

## 2024-02-04 NOTE — Progress Notes (Signed)
 Name:Anita Hobbs MRN: [id number] DOB: Jul 28, 1980   CHIEF COMPLAINT:  HST F/U   HISTORY OF PRESENT ILLNESS: Anita Hobbs is a 43 y.o. w/ a h/o hyperlipidemia who presents to follow up on HST results. The patient underwent HST which revealed moderate OSA (AHI 27, O2 nadir 91%).    EPWORTH SLEEP SCORE 2    01/12/2024    2:37 PM  Results of the Epworth flowsheet  Sitting and reading 0  Watching TV 1  Sitting, inactive in a public place (e.g. a theatre or a meeting) 0  As a passenger in a car for an hour without a break 0  Lying down to rest in the afternoon when circumstances permit 1  Sitting and talking to someone 0  Sitting quietly after a lunch without alcohol 0  In a car, while stopped for a few minutes in traffic 0  Total score 2    PAST MEDICAL HISTORY :   has a past medical history of Allergy, Anxiety, Depression, Dysmenorrhea, Endometriosis, Heart murmur, History of substance abuse (HCC), HLD (hyperlipidemia), Infertility, female, Lactose intolerance, Menorrhagia, and Vitamin D  deficiency.  has a past surgical history that includes Cystectomy; LEEP (N/A, 08/26/2021); and Eye surgery. Prior to Admission medications   Medication Sig Start Date End Date Taking? Authorizing Provider  meclizine  (ANTIVERT ) 12.5 MG tablet Take 1-2 tablets (12.5-25 mg total) by mouth 3 (three) times daily as needed for dizziness. 11/11/23   Anita Hobbs BRAVO, MD  norethindrone  (AYGESTIN ) 5 MG tablet Take 2 tablets (10 mg total) by mouth daily. 05/08/23   Anita Davies, MD  Omega-3 Fatty Acids (FISH OIL) 1000 MG CAPS Take 1,000 mg by mouth daily.    [provider]  rosuvastatin  (CRESTOR ) 10 MG tablet Take 1 tablet (10 mg total) by mouth daily. 10/16/23   Anita Hobbs LABOR, FNP   No Known Allergies  FAMILY HISTORY:  family history includes Breast cancer in an other family member; Dementia in her maternal grandmother; Depression in her mother; Diabetes in her mother; Healthy  in her father; Hypertension in her mother; Mental illness in her mother; Miscarriages / Stillbirths in her maternal grandmother; Obesity in her mother; Skin cancer in an other family member. SOCIAL HISTORY:  reports that she has been smoking cigarettes. She has a 12.5 pack-year smoking history. She has never used smokeless tobacco. She reports that she does not currently use alcohol. She reports that she does not currently use drugs.   Review of Systems:  Gen:  Denies  fever, sweats, chills weight loss  HEENT: Denies blurred vision, double vision, ear pain, eye pain, hearing loss, nose bleeds, sore throat Cardiac:  No dizziness, chest pain or heaviness, chest tightness,edema, No JVD Resp:   No cough, -sputum production, -shortness of breath,-wheezing, -hemoptysis,  Gi: Denies swallowing difficulty, stomach pain, nausea or vomiting, diarrhea, constipation, bowel incontinence Gu:  Denies bladder incontinence, burning urine Ext:   Denies Joint pain, stiffness or swelling Skin: Denies  skin rash, easy bruising or bleeding or hives Endoc:  Denies polyuria, polydipsia , polyphagia or weight change Psych:   Denies depression, insomnia or hallucinations  Other:  All other systems negative  VITAL SIGNS: BP 124/70   Ht 5' (1.524 m)   Wt 125 lb (56.7 kg)   BMI 24.41 kg/m    Physical Examination:   General Appearance: No distress  EYES PERRLA, EOM intact.   NECK Supple, No JVD Pulmonary: normal breath sounds, No wheezing.  CardiovascularNormal  S1,S2.  No m/r/g.   Abdomen: Benign, Soft, non-tender. Skin:   warm, no rashes, no ecchymosis  Extremities: normal, no cyanosis, clubbing. Neuro:without focal findings,  speech normal  PSYCHIATRIC: Mood, affect within normal limits.   ASSESSMENT AND PLAN  OSA Reviewed HST results with patient. Starting on APAP therapy set to 4-16 cm H2O. Discussed the consequences of untreated sleep apnea. Advised not to drive drowsy for safety of patient and  others. Will follow up in 3 months.     Patient  satisfied with Plan of action and management. All questions answered  I spent a total of 25 minutes reviewing chart data, face-to-face evaluation with the patient, counseling and coordination of care as detailed above.    Xolani Degracia, M.D.  Sleep Medicine [facility]

## 2024-02-09 ENCOUNTER — Inpatient Hospital Stay: Attending: Oncology

## 2024-02-09 DIAGNOSIS — G4730 Sleep apnea, unspecified: Secondary | ICD-10-CM

## 2024-02-09 DIAGNOSIS — D751 Secondary polycythemia: Secondary | ICD-10-CM | POA: Diagnosis present

## 2024-02-09 HISTORY — DX: Sleep apnea, unspecified: G47.30

## 2024-02-09 LAB — CBC (CANCER CENTER ONLY)
HCT: 47.2 % — ABNORMAL HIGH (ref 36.0–46.0)
Hemoglobin: 16.4 g/dL — ABNORMAL HIGH (ref 12.0–15.0)
MCH: 33.1 pg (ref 26.0–34.0)
MCHC: 34.7 g/dL (ref 30.0–36.0)
MCV: 95.2 fL (ref 80.0–100.0)
Platelet Count: 298 K/uL (ref 150–400)
RBC: 4.96 MIL/uL (ref 3.87–5.11)
RDW: 13.3 % (ref 11.5–15.5)
WBC Count: 9.9 K/uL (ref 4.0–10.5)
nRBC: 0 % (ref 0.0–0.2)

## 2024-02-09 NOTE — Progress Notes (Unsigned)
 PCP:  Wellington Curtis LABOR, FNP   No chief complaint on file.    HPI:      Ms. Anita Hobbs is a 43 y.o. G0P0000 whose LMP was No LMP recorded. (Menstrual status: Oral contraceptives)., presents today for her annual examination.  Her menses are {norm/abn:715}, lasting {number: 22536} days.  Dysmenorrhea {dysmen:716}. She {does:18564} have intermenstrual bleeding. On aygestin  for DUB/endometriosis  Sex activity: {sex active: 315163}. No pain/bleeding/dryness. 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 There is no FH of ovarian cancer. The patient {does:18564} do self-breast exams.  Tobacco use: {tob:20664} Alcohol use: {Alcohol:11675} No drug use.  Exercise: {exercise:31265}  She {does:18564} get adequate calcium  and Vitamin D  in her diet.  Patient Active Problem List   Diagnosis Date Noted   Annual physical exam 08/02/2019   Tobacco abuse 05/03/2019   Anxiety and depression 05/03/2019   Thrombocytosis 05/03/2019   HLD (hyperlipidemia)    Menorrhagia with irregular cycle 12/07/2018   Dysmenorrhea 01/25/2018   Endometriosis 01/25/2018    Past Surgical History:  Procedure Laterality Date   CYSTECTOMY     @ age 48yrs, rt ovary    EYE SURGERY     LEEP N/A 08/26/2021   Procedure: LOOP ELECTROSURGICAL EXCISION PROCEDURE (LEEP);  Surgeon: Connell Davies, MD;  Location: [facility] ORS;  Service: Gynecology;  Laterality: N/A;    Family History  Problem Relation Age of Onset   Obesity Mother        ? cause of death sepsis    Depression Mother    Mental illness Mother    Diabetes Mother    Hypertension Mother    Healthy Father    Miscarriages / Stillbirths Maternal Grandmother    Dementia Maternal Grandmother    Breast  cancer Other    Skin cancer Other     Social History   Socioeconomic History   Marital status: Married    Spouse name: Amand Lemoine   Number of children: Not on file   Years of education: Not on file   Highest education level: Not on file  Occupational History   Not on file  Tobacco Use   Smoking status: Every Day    Current packs/day: 0.50    Average packs/day: 0.5 packs/day for 25.0 years (12.5 ttl pk-yrs)    Types: Cigarettes   Smokeless tobacco: Never  Vaping Use   Vaping status: Never Used  Substance and Sexual Activity   Alcohol use: Not Currently   Drug use: Not Currently    Comment: history of substance abuse (Percocet)   Sexual activity: Yes    Birth control/protection: None, Pill  Other Topics Concern   Not on file  Social History Narrative   Dogs    Smoker    DPR Jenaye Rickert [phone]   Divorced    Smoker since age 73 y.o   Social Drivers of Corporate investment banker Strain: Not on file  Food Insecurity: No Food Insecurity (11/24/2023)   Hunger Vital Sign    Worried About Running Out of Food in the Last Year: Never true    Ran Out of Food in the Last Year: Never true  Transportation Needs: No Transportation Needs (11/24/2023)   PRAPARE -  Administrator, Civil Service (Medical): No    Lack of Transportation (Non-Medical): No  Physical Activity: Not on file  Stress: Not on file  Social Connections: Not on file  Intimate Partner Violence: Not At Risk (11/24/2023)   Humiliation, Afraid, Rape, and Kick questionnaire    Fear of Current or Ex-Partner: No    Emotionally Abused: No    Physically Abused: No    Sexually Abused: No     Current Outpatient Medications:    meclizine  (ANTIVERT ) 12.5 MG tablet, Take 1-2 tablets (12.5-25 mg total) by mouth 3 (three) times daily as needed for dizziness., Disp: 30 tablet, Rfl: 0   norethindrone  (AYGESTIN ) 5 MG tablet, Take 2 tablets (10 mg total) by mouth daily., Disp: 180 tablet, Rfl: 2    Omega-3 Fatty Acids (FISH OIL) 1000 MG CAPS, Take 1,000 mg by mouth daily., Disp: , Rfl:    rosuvastatin  (CRESTOR ) 10 MG tablet, Take 1 tablet (10 mg total) by mouth daily., Disp: 90 tablet, Rfl: 3     ROS:  Review of Systems BREAST: No symptoms   Objective: There were no vitals taken for this visit.   OBGyn Exam  Results: Results for orders placed or performed in visit on 02/09/24 (from the past 24 hours)  CBC (Cancer Center Only)     Status: Abnormal   Collection Time: 02/09/24  3:44 PM  Result Value Ref Range   WBC Count 9.9 4.0 - 10.5 K/uL   RBC 4.96 3.87 - 5.11 MIL/uL   Hemoglobin 16.4 (H) 12.0 - 15.0 g/dL   HCT 52.7 (H) 63.9 - 53.9 %   MCV 95.2 80.0 - 100.0 fL   MCH 33.1 26.0 - 34.0 pg   MCHC 34.7 30.0 - 36.0 g/dL   RDW 86.6 88.4 - 84.4 %   Platelet Count 298 150 - 400 K/uL   nRBC 0.0 0.0 - 0.2 %    Assessment/Plan: No diagnosis found.  No orders of the defined types were placed in this encounter.            GYN counsel {counseling: 16159}     F/U  No follow-ups on file.  Kaleah Hagemeister B. Yoshi Vicencio, PA-C 02/09/2024 5:11 PM

## 2024-02-10 ENCOUNTER — Other Ambulatory Visit (HOSPITAL_COMMUNITY)
Admission: RE | Admit: 2024-02-10 | Discharge: 2024-02-10 | Disposition: A | Discharge status: Home / Self Care | Source: Ambulatory Visit | Attending: Obstetrics and Gynecology | Admitting: Obstetrics and Gynecology

## 2024-02-10 ENCOUNTER — Ambulatory Visit: Admitting: Obstetrics

## 2024-02-10 ENCOUNTER — Ambulatory Visit (INDEPENDENT_AMBULATORY_CARE_PROVIDER_SITE_OTHER): Admitting: Obstetrics and Gynecology

## 2024-02-10 ENCOUNTER — Encounter: Payer: Self-pay | Admitting: Obstetrics and Gynecology

## 2024-02-10 VITALS — BP 110/66 | HR 83 | Ht 60.0 in | Wt 125.0 lb

## 2024-02-10 DIAGNOSIS — Z01419 Encounter for gynecological examination (general) (routine) without abnormal findings: Secondary | ICD-10-CM

## 2024-02-10 DIAGNOSIS — Z9889 Other specified postprocedural states: Secondary | ICD-10-CM | POA: Diagnosis present

## 2024-02-10 DIAGNOSIS — Z1231 Encounter for screening mammogram for malignant neoplasm of breast: Secondary | ICD-10-CM

## 2024-02-10 DIAGNOSIS — Z124 Encounter for screening for malignant neoplasm of cervix: Secondary | ICD-10-CM | POA: Insufficient documentation

## 2024-02-10 DIAGNOSIS — N809 Endometriosis, unspecified: Secondary | ICD-10-CM

## 2024-02-10 DIAGNOSIS — Z1151 Encounter for screening for human papillomavirus (HPV): Secondary | ICD-10-CM | POA: Diagnosis present

## 2024-02-10 DIAGNOSIS — Z3041 Encounter for surveillance of contraceptive pills: Secondary | ICD-10-CM

## 2024-02-10 MED ORDER — NORETHINDRONE ACETATE 5 MG PO TABS: 10.0000 mg | ORAL_TABLET | Freq: Every day | ORAL | 3 refills | Status: AC

## 2024-02-10 NOTE — Patient Instructions (Signed)
 I value your feedback and you entrusting [place] with your care. If you get a [place] patient survey, I would appreciate you taking the time to let us know about your experience today. Thank you! ? ? ?

## 2024-02-15 LAB — CYTOLOGY - PAP
Adequacy: ABSENT
Comment: NEGATIVE
Diagnosis: NEGATIVE
High risk HPV: NEGATIVE

## 2024-02-23 ENCOUNTER — Ambulatory Visit: Payer: Self-pay | Admitting: Sleep Medicine

## 2024-02-23 NOTE — Telephone Encounter (Signed)
 Pt has been responded to via MyChart and message has been routed to Dr. Jess to advise. Pt encouraged to attend upcoming appointment. NFN.

## 2024-02-23 NOTE — Telephone Encounter (Signed)
 FYI Only or Action Required?: FYI only for provider: See note below.  Patient is followed in Pulmonology for OSA, last seen on 02/04/2024 by Anita Devona BIRCH, MD.  Called Nurse Triage reporting PCP Call.   Triage Disposition: Call PCP Now  Patient/caregiver understands and will follow disposition?: Yes         Copied from CRM #[id number]. Topic: Clinical - Red Word Triage >> Feb 23, 2024  3:39 PM Anita Hobbs wrote: Red Word that prompted transfer to Nurse Triage: Pt called in at 252pm spoke with E2C[facility] and a CRM was sent back to the clinical staff stating Pt cpap machine not going well and is suppose to be getting a new one. Oxygen level has dropped using the cpap machine says it's hurting her and not helping. She is being told she has to wear it in order for her insurance to cover it. Levels while using it is 92 when she takes it off it back at 97. Happened within 15 mins. Pt wants to know what she can do to keep the insurance paying for the one that's on the way w/o killing her in her sleep using the the other one  Pt called back wanting an answer today very demanding on needing an answer on what she should do. Should she continue wearing a mask that causes SOB, Difficulty breathing, and lower o2 levels, or find a way around using it until a replacement comes while meeting the time quota for usage.   Pt stated she's having SOB and difficulty breathing while using the CPAP machine. Pt sees Dr. Jess in [place]. Reason for Disposition  [1] Caller requests to speak ONLY to PCP AND [2] URGENT question  Answer Assessment - Initial Assessment Questions 1. REASON FOR CALL or QUESTION: What is your reason for calling today? or How can I best    Pt. Has CPAP for sleep apnea. She states x 2 weeks the machine brings her O2 from 97% to 92% sitting up. She stated the insurance company will not pay for the machine if she's not using it for a certain amount of time. She wants to know how to  proceed. She states she has sent messages on MyChart with no response. Please follow -up with with patient.  Protocols used: PCP Call - No Triage-A-AH

## 2024-02-24 ENCOUNTER — Telehealth: Payer: Self-pay

## 2024-02-24 NOTE — Telephone Encounter (Signed)
 Copied from CRM #[id number]. Topic: Clinical - Medical Advice >> Feb 23, 2024  2:52 PM Gustabo D wrote: Pt cpap machine not going well and is suppose to be getting a new one. Oxygen level has dropped using the cpap machine says it's hurting her and not helping. She is being told she has to wear it in order for her insurance to cover it. Levels while using it is 92 when she takes it off it back at 97. Happened within 15 mins. Pt wants to know what she can do to keep the insurance paying for the one that's on the way w/o killing her in her sleep using the the other one

## 2024-02-24 NOTE — Telephone Encounter (Signed)
 Last read by Harlene JINNY Arvilla Noemi at 2:53PM on 02/24/2024.

## 2024-02-24 NOTE — Telephone Encounter (Signed)
 Pulmonary prescribed this for her Dr. Jess - she needs to reach out to them .

## 2024-02-25 ENCOUNTER — Ambulatory Visit: Admitting: Sleep Medicine

## 2024-03-30 ENCOUNTER — Telehealth: Payer: Self-pay | Admitting: Sleep Medicine

## 2024-03-30 NOTE — Telephone Encounter (Signed)
 Needs CPAP compliance appointment prior to 05/09/24

## 2024-04-18 ENCOUNTER — Other Ambulatory Visit

## 2024-04-25 ENCOUNTER — Inpatient Hospital Stay: Attending: Oncology

## 2024-04-25 DIAGNOSIS — D751 Secondary polycythemia: Secondary | ICD-10-CM

## 2024-04-25 LAB — CBC (CANCER CENTER ONLY)
HCT: 47 % — ABNORMAL HIGH (ref 36.0–46.0)
Hemoglobin: 16.7 g/dL — ABNORMAL HIGH (ref 12.0–15.0)
MCH: 33.5 pg (ref 26.0–34.0)
MCHC: 35.5 g/dL (ref 30.0–36.0)
MCV: 94.4 fL (ref 80.0–100.0)
Platelet Count: 279 K/uL (ref 150–400)
RBC: 4.98 MIL/uL (ref 3.87–5.11)
RDW: 13 % (ref 11.5–15.5)
WBC Count: 8.2 K/uL (ref 4.0–10.5)
nRBC: 0 % (ref 0.0–0.2)

## 2024-04-28 ENCOUNTER — Other Ambulatory Visit: Payer: Self-pay

## 2024-04-28 ENCOUNTER — Inpatient Hospital Stay: Admitting: Oncology

## 2024-04-28 DIAGNOSIS — D751 Secondary polycythemia: Secondary | ICD-10-CM | POA: Diagnosis not present

## 2024-04-28 NOTE — Progress Notes (Unsigned)
 On CPAP for apnea, would like to discuss levels.  Oxygen dropped to 89% before CPAP; now not dropping below 95% on CPAP.  Concerned Hgb levels are not going down. Once in a while takes melatonin, not often. Has sinus issues.  Vomitted and diarheaa once after eating something.  I'm a smoker wakes up with dry mouth, hacky and phlegmy.

## 2024-04-30 NOTE — Progress Notes (Signed)
 I connected with Anita Hobbs on 04/30/2024 at 10:00 AM EST by video enabled telemedicine visit and verified that I am speaking with the correct person using two identifiers.   I discussed the limitations, risks, security and privacy concerns of performing an evaluation and management service by telemedicine and the availability of in-person appointments. I also discussed with the patient that there may be a patient responsible charge related to this service. The patient expressed understanding and agreed to proceed.  Other persons participating in the visit and their role in the encounter:  none  Patient's location:  home/ [place] Provider's location:  home  Chief Complaint:  routine f/u of secondary polycythemia  History of present illness: Patient is a 44 year old female who was last seen by me in 2021 for thrombocytosis which was believed to be reactive secondary to iron deficiency. She has now been referred for polycythemia. Patient's hemoglobin up until April 2021 was around 15 and since the last 2 years her hemoglobin has been between 16-17. Patient denies any changes in her medications. She reports getting a good night sleep and denies any symptoms of snoring or daytime drowsiness. She is a non-smoker. Denies any exogenous testosterone use.    Results of blood work from 11/24/2023 were as follows: CBC showed white count of 8.6, H&H of 17.3/49.5 and a platelet count of 261.  JAK2 mutation testing was negative.  CALR, MPL and exon 12 mutation testing negative.  No EPO levels normal at 5.6.    Interval history patient was diagnosed with sleep apnea and after trying 2 different devices she is presently using a nasal CPAP and reports getting better quality of sleep.  Also she has been checking her nighttime oxygen saturations and says that it has remained more than 90%.  Denies other complaints at this time   Review of Systems  Constitutional:  Negative for chills, fever, malaise/fatigue and  weight loss.  HENT:  Negative for congestion, ear discharge and nosebleeds.   Eyes:  Negative for blurred vision.  Respiratory:  Negative for cough, hemoptysis, sputum production, shortness of breath and wheezing.   Cardiovascular:  Negative for chest pain, palpitations, orthopnea and claudication.  Gastrointestinal:  Negative for abdominal pain, blood in stool, constipation, diarrhea, heartburn, melena, nausea and vomiting.  Genitourinary:  Negative for dysuria, flank pain, frequency, hematuria and urgency.  Musculoskeletal:  Negative for back pain, joint pain and myalgias.  Skin:  Negative for rash.  Neurological:  Negative for dizziness, tingling, focal weakness, seizures, weakness and headaches.  Endo/Heme/Allergies:  Does not bruise/bleed easily.  Psychiatric/Behavioral:  Negative for depression and suicidal ideas. The patient does not have insomnia.     Allergies[1]  Past Medical History:  Diagnosis Date   Allergy    Anxiety    Depression    Dysmenorrhea    Endometriosis    Heart murmur    pinhole size murmur   History of substance abuse (HCC)    Percocet   HLD (hyperlipidemia)    Infertility, female    Lactose intolerance    Menorrhagia    Sleep apnea 02/09/2024   Vitamin D  deficiency     Past Surgical History:  Procedure Laterality Date   CYSTECTOMY     @ age 37yrs, rt ovary    EYE SURGERY     LEEP N/A 08/26/2021   Procedure: LOOP ELECTROSURGICAL EXCISION PROCEDURE (LEEP);  Surgeon: Connell Davies, MD;  Location: [facility] ORS;  Service: Gynecology;  Laterality: N/A;    Social History  Socioeconomic History   Marital status: Married    Spouse name: Anita Hobbs   Number of children: Not on file   Years of education: Not on file   Highest education level: Not on file  Occupational History   Not on file  Tobacco Use   Smoking status: Every Day    Current packs/day: 0.50    Average packs/day: 0.5 packs/day for 25.0 years (12.5 ttl pk-yrs)    Types:  Cigarettes   Smokeless tobacco: Never  Vaping Use   Vaping status: Never Used  Substance and Sexual Activity   Alcohol use: Not Currently   Drug use: Not Currently    Comment: history of substance abuse (Percocet)   Sexual activity: Yes    Birth control/protection: Pill  Other Topics Concern   Not on file  Social History Narrative   Dogs    Smoker    DPR Anita Hobbs [phone]   Divorced    Smoker since age 19 y.o   Social Drivers of Health   Tobacco Use: High Risk (02/10/2024)   Patient History    Smoking Tobacco Use: Every Day    Smokeless Tobacco Use: Never    Passive Exposure: Not on file  Financial Resource Strain: Not on file  Food Insecurity: No Food Insecurity (11/24/2023)   Epic    Worried About Programme Researcher, Broadcasting/film/video in the Last Year: Never true    Ran Out of Food in the Last Year: Never true  Transportation Needs: No Transportation Needs (11/24/2023)   Epic    Lack of Transportation (Medical): No    Lack of Transportation (Non-Medical): No  Physical Activity: Not on file  Stress: Not on file  Social Connections: Not on file  Intimate Partner Violence: Not At Risk (11/24/2023)   Epic    Fear of Current or Ex-Partner: No    Emotionally Abused: No    Physically Abused: No    Sexually Abused: No  Depression (PHQ2-9): Low Risk (04/28/2024)   Depression (PHQ2-9)    PHQ-2 Score: 0  Alcohol Screen: Not on file  Housing: Low Risk (11/24/2023)   Epic    Unable to Pay for Housing in the Last Year: No    Number of Times Moved in the Last Year: 0    Homeless in the Last Year: No  Utilities: Not At Risk (11/24/2023)   Epic    Threatened with loss of utilities: No  Health Literacy: Not on file    Family History  Problem Relation Age of Onset   Obesity Mother        ? cause of death sepsis    Depression Mother    Mental illness Mother    Diabetes Mother    Hypertension Mother    Healthy Father    Miscarriages / Stillbirths Maternal Grandmother    Dementia  Maternal Grandmother    Breast cancer Other    Skin cancer Other     Current Medications[2]  No results found.  No images are attached to the encounter.      Latest Ref Rng & Units 10/15/2023    3:45 PM  CMP  Glucose 70 - 99 mg/dL 81   BUN 6 - 24 mg/dL 8   Creatinine 9.42 - 8.99 mg/dL 9.20   Sodium 865 - 855 mmol/L 137   Potassium 3.5 - 5.2 mmol/L 4.4   Chloride 96 - 106 mmol/L 101   CO2 20 - 29 mmol/L 21   Calcium  8.7 -  10.2 mg/dL 9.3   Total Protein 6.0 - 8.5 g/dL 6.9   Total Bilirubin 0.0 - 1.2 mg/dL 0.6   Alkaline Phos 44 - 121 IU/L 73   AST 0 - 40 IU/L 19   ALT 0 - 32 IU/L 35       Latest Ref Rng & Units 04/25/2024    2:40 PM  CBC  WBC 4.0 - 10.5 K/uL 8.2   Hemoglobin 12.0 - 15.0 g/dL 83.2   Hematocrit 63.9 - 46.0 % 47.0   Platelets 150 - 400 K/uL 279      Observation/objective: Appears in no acute distress over video visit today.  Breathing is nonlabored  Assessment and plan: Patient is a 44 year old female and this is a routine follow-up of secondary polycythemia  Testing for primary polycythemia including JAK2, EPO, CALR, MPL and exon 12 testing negative.  Cause of polycythemia is unclear and may be related to sleep apnea.  She has been using PAP machine for the last 2 months but there has been no improvement in her hemoglobin so far.  H&H remains at 16.7/47.  She will not be getting phlebotomy at this time since we are not dealing with primary polycythemia vera although I have not done the bone marrow biopsy.  I would like to refer her to [facility] benign hematology to see if any additional tests are needed at this time to a certain the etiology of her secondary polycythemia.  CBC in 3 and 6 months and I will see her back in 6 months  Follow-up instructions: As above  I discussed the assessment and treatment plan with the patient. The patient was provided an opportunity to ask questions and all were answered. The patient agreed with the plan and demonstrated an  understanding of the instructions.   The patient was advised to call back or seek an in-person evaluation if the symptoms worsen or if the condition fails to improve as anticipated.  I provided 12 minutes of face-to-face video visit time during this encounter, and > 50% was spent counseling as documented under my assessment & plan.  Visit Diagnosis: 1. Polycythemia, secondary     Dr. Annah Skene, MD, MPH [facility] at [facility] Tel- [phone] 04/30/2024 5:42 PM     [1] No Known Allergies [2]  Current Outpatient Medications:    meclizine  (ANTIVERT ) 12.5 MG tablet, Take 1-2 tablets (12.5-25 mg total) by mouth 3 (three) times daily as needed for dizziness., Disp: 30 tablet, Rfl: 0   norethindrone  (AYGESTIN ) 5 MG tablet, Take 2 tablets (10 mg total) by mouth daily., Disp: 180 tablet, Rfl: 3   Omega-3 Fatty Acids (FISH OIL) 1000 MG CAPS, Take 1,000 mg by mouth daily. (Patient taking differently: Take 1,000 mg by mouth daily. Not taking daily currently.), Disp: , Rfl:    rosuvastatin  (CRESTOR ) 10 MG tablet, Take 1 tablet (10 mg total) by mouth daily., Disp: 90 tablet, Rfl: 3

## 2024-05-11 ENCOUNTER — Telehealth: Payer: Self-pay

## 2024-05-11 ENCOUNTER — Ambulatory Visit: Payer: Self-pay

## 2024-05-11 NOTE — Telephone Encounter (Signed)
 Needs to be seen. Recommend urgent care if no appts available

## 2024-05-11 NOTE — Telephone Encounter (Signed)
 Patient called back into triage to see if there was appt. Availability today. Pt. Was advised there is not per Epic. She agrees to keep appt. For 1/22.

## 2024-05-11 NOTE — Telephone Encounter (Signed)
-----   Message from Annah Skene, MD sent at 05/06/2024 10:59 AM EST ----- Regarding: FW: UNABLE TO CONTACT PATIENT- [facility] Please let patient know that [facility] was not able to get in touch with her and see how we can facilitate this ----- Message ----- From: Melba Grayce MATSU Sent: 05/06/2024  10:53 AM EST To: Annah JAYSON Skene, MD; Richerd Schmitz, CMA; Merce# Subject: UNABLE TO CONTACT PATIENT- [facility]  UNABLE TO CONTACT PATIENT- [facility] sent to her chart.

## 2024-05-11 NOTE — Telephone Encounter (Signed)
" ° ° ° °  Reason for Triage: coughing a lot feels winded, hot and cold, no fever but feels like she has one. Reason for Disposition  [1] MILD difficulty breathing (e.g., minimal/no SOB at rest, SOB with walking, pulse < 100) AND [2] still present when not coughing  Answer Assessment - Initial Assessment Questions Cough x a few days, today got worse. Stuffy nose, shortness of breath even with short distances, denies any fever. Everyone at work as been sick. She states the coughing is coming from her chest and it's violent coughs. Due to schedule and symptoms, RN recommended UC. Pt states it costs her too much money to go to UC. Please advise.      1. ONSET: When did the cough begin?      A couple of days ago.  2. SEVERITY: How bad is the cough today?      States cough is violent today 3. SPUTUM: Describe the color of your sputum (e.g., none, dry cough; clear, white, yellow, green)     No sputum 4. HEMOPTYSIS: Are you coughing up any blood? If Yes, ask: How much? (e.g., flecks, streaks, tablespoons, etc.)     denies 5. DIFFICULTY BREATHING: Are you having difficulty breathing? If Yes, ask: How bad is it? (e.g., mild, moderate, severe)      Mild, with movement 6. FEVER: Do you have a fever? If Yes, ask: What is your temperature, how was it measured, and when did it start?     Denies but states she feels like she has one, gets hot and cold 7. CARDIAC HISTORY: Do you have any history of heart disease? (e.g., heart attack, congestive heart failure)      no 8. LUNG HISTORY: Do you have any history of lung disease?  (e.g., pulmonary embolus, asthma, emphysema)     no 9. PE RISK FACTORS: Do you have a history of blood clots? (or: recent major surgery, recent prolonged travel, bedridden)     States she did recently go on vacation 10. OTHER SYMPTOMS: Do you have any other symptoms? (e.g., runny nose, wheezing, chest pain)       Stuffy nose 11. PREGNANCY: Is there any  chance you are pregnant? When was your last menstrual period?       denies  Protocols used: Cough - Acute Non-Productive-A-AH  "

## 2024-05-11 NOTE — Telephone Encounter (Signed)
 Per Dr. Melanee Please let patient know that [facility] was not able to get in touch with her and see how we can facilitate this.  Outbound call to patient, informed of above.  Patient indicated she was away on vacation and when she got back got sick.  Said it will be another couple of days before she reaches out to them but did confirm she received their messages.  Provided telephone number on letter for patient to return call.

## 2024-05-11 NOTE — Telephone Encounter (Signed)
 FYI Only or Action Required?: Action required by provider: request for appointment and update on patient condition.  Patient was last seen in primary care on 11/11/2023 by Gasper Nancyann BRAVO, MD.  Called Nurse Triage reporting Cough.  Symptoms began several days ago.  Interventions attempted: Nothing.  Symptoms are: gradually worsening.  Triage Disposition: See HCP Within 4 Hours (Or PCP Triage)  Patient/caregiver understands and will follow disposition?: No, wishes to speak with PCP

## 2024-05-11 NOTE — Telephone Encounter (Signed)
 This encounter was created in error - please disregard.

## 2024-05-12 ENCOUNTER — Ambulatory Visit: Admission: RE | Admit: 2024-05-12 | Discharge: 2024-05-12 | Disposition: A | Discharge status: Home / Self Care

## 2024-05-12 ENCOUNTER — Ambulatory Visit

## 2024-05-12 ENCOUNTER — Ambulatory Visit
Admission: RE | Admit: 2024-05-12 | Discharge: 2024-05-12 | Disposition: A | Discharge status: Home / Self Care | Source: Ambulatory Visit

## 2024-05-12 ENCOUNTER — Ambulatory Visit: Payer: Self-pay

## 2024-05-12 VITALS — BP 119/78 | HR 85 | Temp 98.6°F

## 2024-05-12 DIAGNOSIS — R051 Acute cough: Secondary | ICD-10-CM | POA: Insufficient documentation

## 2024-05-12 LAB — POCT INFLUENZA A/B
Influenza A, POC: NEGATIVE
Influenza B, POC: NEGATIVE

## 2024-05-12 LAB — POC COVID19 BINAXNOW: SARS Coronavirus 2 Ag: NEGATIVE

## 2024-05-12 MED ORDER — DOXYCYCLINE HYCLATE 100 MG PO TABS: 100.0000 mg | ORAL_TABLET | Freq: Two times a day (BID) | ORAL | 0 refills | Status: AC

## 2024-05-12 MED ORDER — BENZONATATE 100 MG PO CAPS: 100.0000 mg | ORAL_CAPSULE | Freq: Two times a day (BID) | ORAL | 0 refills | Status: AC | PRN

## 2024-05-12 NOTE — Progress Notes (Signed)
 "     Acute visit   Patient: Anita Hobbs   DOB: 26-Jan-1981   43 y.o. Female  MRN: [id number] PCP: Franchot Isaiah LABOR, MD   Chief Complaint  Patient presents with   Cough    Walking around in her house No fever no nausea vomitting but has diarrhea since the 15th of december    Subjective    Discussed the use of AI scribe software for clinical note transcription with the patient, who gave verbal consent to proceed.  History of Present Illness Anita Hobbs is a 44 year old female with sleep apnea who presents with a persistent cough and shortness of breath.  She has been experiencing symptoms since January 15th, following a cruise. Initially, she had diarrhea, which has since resolved, but she developed a persistent phlegmy cough with clear sputum that has worsened over the past three days. She also experiences significant chest congestion and shortness of breath, particularly during physical activities like walking or doing dishes.  Her temperature, which usually runs around 13F, has occasionally reached 98.19F. She feels feverish, especially over her eyes, but has not consistently measured a fever. No ear pain or sinus pressure is present, but she reports a stuffy nose and sore throat.  She has been using over-the-counter medications such as DayQuil, Mucinex, throat spray, and cough drops to manage her symptoms. Her workplace has been affected by illness, with one colleague hospitalized and diagnosed with a viral infection.  She was diagnosed with sleep apnea a few months ago and uses a CPAP machine. Her oxygen saturation dropped to 92% this morning before using the CPAP, which improved her breathing. Despite feeling cold and feverish, she has been working double shifts due to staffing issues at her job.  Review of systems as noted in HPI.   Objective    BP 119/78   Pulse 85   Temp 98.6 F (37 C) (Oral)   SpO2 99%  Physical Exam Constitutional:       Appearance: Normal appearance.  HENT:     Head: Normocephalic and atraumatic.     Mouth/Throat:     Mouth: Mucous membranes are moist.  Eyes:     Pupils: Pupils are equal, round, and reactive to light.  Cardiovascular:     Rate and Rhythm: Normal rate and regular rhythm.     Heart sounds: Normal heart sounds.  Pulmonary:     Effort: Pulmonary effort is normal. No respiratory distress.     Breath sounds: Normal breath sounds.  Skin:    General: Skin is warm.  Neurological:     General: No focal deficit present.     Mental Status: She is alert.       Results for orders placed or performed in visit on 05/12/24  POCT Influenza A/B  Result Value Ref Range   Influenza A, POC Negative Negative   Influenza B, POC Negative Negative  POC COVID-19 BinaxNow  Result Value Ref Range   SARS Coronavirus 2 Ag Negative Negative    Assessment & Plan     Problem List Items Addressed This Visit   None Visit Diagnoses       Acute cough    -  Primary   Relevant Medications   doxycycline  (VIBRA -TABS) 100 MG tablet   benzonatate  (TESSALON ) 100 MG capsule   Other Relevant Orders   POCT Influenza A/B (Completed)   POC COVID-19 BinaxNow (Completed)   DG Chest 2 View (Completed)  Assessment & Plan Acute cough Patient with 1 week hx of lower respiratory infection with productive cough, chest congestion, and dyspnea. No hx of asthma or COPD. No wheezing on exam. O2 sat is normal in clinic. Differential includes bronchitis and pneumonia. Negative for COVID and flu. - Ordered chest x-ray to evaluate for pneumonia. - Prescribed doxycycline  100 mg BID for 7 days. - Prescribed Tessalon  Perles PRN for cough. - Continue Mucinex PRN - Advised to seek emergency care if increased dyspnea.   Meds ordered this encounter  Medications   doxycycline  (VIBRA -TABS) 100 MG tablet    Sig: Take 1 tablet (100 mg total) by mouth 2 (two) times daily for 7 days.    Dispense:  14 tablet    Refill:  0    benzonatate  (TESSALON ) 100 MG capsule    Sig: Take 1 capsule (100 mg total) by mouth 2 (two) times daily as needed for cough.    Dispense:  30 capsule    Refill:  0     No follow-ups on file.      Isaiah DELENA Pepper, MD  [facility] [phone] (phone) [phone] (fax) "

## 2024-05-16 ENCOUNTER — Ambulatory Visit: Admitting: Sleep Medicine

## 2024-05-24 ENCOUNTER — Ambulatory Visit: Admitting: Sleep Medicine

## 2024-05-26 ENCOUNTER — Encounter: Payer: Self-pay | Admitting: Obstetrics and Gynecology

## 2024-05-26 ENCOUNTER — Other Ambulatory Visit: Payer: Self-pay

## 2024-05-30 ENCOUNTER — Ambulatory Visit: Admitting: Sleep Medicine

## 2024-07-27 ENCOUNTER — Inpatient Hospital Stay

## 2024-10-26 ENCOUNTER — Inpatient Hospital Stay

## 2024-10-26 ENCOUNTER — Inpatient Hospital Stay: Admitting: Oncology
